# Patient Record
Sex: Female | Born: 1960
Health system: Southern US, Community
[De-identification: ages and names within clinical notes are randomized; demographics above are authoritative.]

## PROBLEM LIST (undated history)

## (undated) DIAGNOSIS — K2270 Barrett's esophagus without dysplasia: Secondary | ICD-10-CM

## (undated) DIAGNOSIS — K219 Gastro-esophageal reflux disease without esophagitis: Secondary | ICD-10-CM

## (undated) DIAGNOSIS — T7840XA Allergy, unspecified, initial encounter: Secondary | ICD-10-CM

## (undated) DIAGNOSIS — IMO0001 Reserved for inherently not codable concepts without codable children: Secondary | ICD-10-CM

## (undated) DIAGNOSIS — G4730 Sleep apnea, unspecified: Secondary | ICD-10-CM

## (undated) DIAGNOSIS — M7120 Synovial cyst of popliteal space [Baker], unspecified knee: Secondary | ICD-10-CM

## (undated) DIAGNOSIS — E785 Hyperlipidemia, unspecified: Secondary | ICD-10-CM

## (undated) HISTORY — PX: TUBAL LIGATION: SHX77

## (undated) HISTORY — PX: ESOPHAGOGASTRODUODENOSCOPY: SHX1529

## (undated) HISTORY — DX: Reserved for inherently not codable concepts without codable children: IMO0001

## (undated) HISTORY — DX: Barrett's esophagus without dysplasia: K22.70

## (undated) HISTORY — DX: Sleep apnea, unspecified: G47.30

## (undated) HISTORY — PX: COLONOSCOPY: SHX5424

## (undated) HISTORY — DX: Synovial cyst of popliteal space (Baker), unspecified knee: M71.20

## (undated) HISTORY — DX: Hyperlipidemia, unspecified: E78.5

## (undated) HISTORY — DX: Gastro-esophageal reflux disease without esophagitis: K21.9

## (undated) HISTORY — PX: NASAL SINUS SURGERY: SHX719

## (undated) HISTORY — DX: Allergy, unspecified, initial encounter: T78.40XA

---

## 1998-11-28 ENCOUNTER — Encounter: Admission: RE | Admit: 1998-11-28 | Discharge: 1999-02-26 | Payer: Self-pay | Admitting: Orthopedic Surgery

## 2001-05-14 ENCOUNTER — Other Ambulatory Visit: Admission: RE | Admit: 2001-05-14 | Discharge: 2001-05-14 | Payer: Self-pay | Admitting: Obstetrics & Gynecology

## 2002-05-31 ENCOUNTER — Other Ambulatory Visit: Admission: RE | Admit: 2002-05-31 | Discharge: 2002-05-31 | Payer: Self-pay | Admitting: Obstetrics & Gynecology

## 2003-08-01 ENCOUNTER — Other Ambulatory Visit: Admission: RE | Admit: 2003-08-01 | Discharge: 2003-08-01 | Payer: Self-pay | Admitting: Obstetrics & Gynecology

## 2003-08-12 ENCOUNTER — Ambulatory Visit (HOSPITAL_COMMUNITY): Admission: RE | Admit: 2003-08-12 | Discharge: 2003-08-12 | Payer: Self-pay | Admitting: Obstetrics & Gynecology

## 2005-01-01 ENCOUNTER — Other Ambulatory Visit: Admission: RE | Admit: 2005-01-01 | Discharge: 2005-01-01 | Payer: Self-pay | Admitting: Obstetrics & Gynecology

## 2005-07-31 ENCOUNTER — Other Ambulatory Visit: Admission: RE | Admit: 2005-07-31 | Discharge: 2005-07-31 | Payer: Self-pay | Admitting: Obstetrics & Gynecology

## 2006-01-22 ENCOUNTER — Other Ambulatory Visit: Admission: RE | Admit: 2006-01-22 | Discharge: 2006-01-22 | Payer: Self-pay | Admitting: Obstetrics & Gynecology

## 2007-10-22 HISTORY — PX: CHOLECYSTECTOMY: SHX55

## 2010-04-09 ENCOUNTER — Encounter: Admission: RE | Admit: 2010-04-09 | Discharge: 2010-04-09 | Payer: Self-pay | Admitting: Family Medicine

## 2011-02-19 ENCOUNTER — Ambulatory Visit (INDEPENDENT_AMBULATORY_CARE_PROVIDER_SITE_OTHER): Payer: Self-pay | Admitting: Internal Medicine

## 2011-03-08 NOTE — Op Note (Signed)
   NAME:  Rachel Larson, Rachel Larson                         ACCOUNT NO.:  [id number]   MEDICAL RECORD NO.:  [id number]                   PATIENT TYPE:  AMB   LOCATION:  SDC                                  FACILITY:  [facility]   PHYSICIAN:  Freddy Finner, M.D.                DATE OF BIRTH:  19-Oct-1961   DATE OF PROCEDURE:  08/12/2003  DATE OF DISCHARGE:                                 OPERATIVE REPORT   PREOPERATIVE DIAGNOSIS:  Multiparity, requests voluntary sterilization.   POSTOPERATIVE DIAGNOSIS:  Multiparity, requests voluntary sterilization plus  symmetrical enlargement of the uterus and minimal evidence of pelvic  endometriosis.   PROCEDURE:  Laparoscopy, bipolar fulguration and sharp division of the  isthmic portion of each fallopian tube. Fulguration of endometriotic implant  in cul-de-sac.   ANESTHESIA:  General.   ESTIMATED BLOOD LOSS:  Less than 5 mL.   COMPLICATIONS:  None.   DESCRIPTION OF PROCEDURE:  The patient was admitted on the morning of  surgery and brought to the operating room and placed under adequate general  anesthesia and placed in the dorsal lithotomy position using the [facility]  stirrup system. Betadine preparation of the abdomen, perineum and vagina was  carried out in the usual fashion. The bladder was evacuated using sterile  technique  using the [facility] catheter. The [place] tenaculum was attached to  the cervix without difficulty. Sterile drapes were applied.   A spinal infraumbilical skin incision was made and a 12-mm disposable non  bladed trocar was introduced. Direct inspection with the laparoscope  revealed  adequate placement with no evidence of injury on entry.  Pneumoperitoneum was allowed to accumulate with CO2 gas.   Systematic examination  of the pelvic and abdominal  contents was carried  out. No abnormal findings were noted except those noted in the postoperative  diagnosis. The isthmic portion of each fallopian tube was then fulgurated  with the  Kleppinger forceps which are bipolar forceps. The fulgurated  segment was divided. Repeat fulguration was done on the left for complete  hemostasis.   The procedure was then terminated. All instruments were removed. Local  anesthesia was placed using 0.5% plain Marcaine at the umbilicus. The  incision was closed with interrupted subcuticular sutures of 3-0 Dexon.   The patient was then awakened and taken to the recovery room in good  condition. She will be discharged with routine post surgical instructions.  She has Vicodin  as needed for postoperative pain.                                               Freddy Finner, M.D.   WRN/MEDQ  D:  08/12/2003  T:  08/12/2003  Job:  [id number]

## 2011-04-09 ENCOUNTER — Ambulatory Visit (INDEPENDENT_AMBULATORY_CARE_PROVIDER_SITE_OTHER): Payer: Self-pay | Admitting: Internal Medicine

## 2011-05-29 ENCOUNTER — Encounter: Payer: Self-pay | Admitting: Cardiology

## 2011-06-26 ENCOUNTER — Ambulatory Visit (INDEPENDENT_AMBULATORY_CARE_PROVIDER_SITE_OTHER): Payer: BC Managed Care – PPO | Admitting: Cardiology

## 2011-06-26 ENCOUNTER — Encounter: Payer: Self-pay | Admitting: Cardiology

## 2011-06-26 VITALS — BP 122/82 | HR 108 | Resp 16 | Ht 60.0 in | Wt 173.0 lb

## 2011-06-26 DIAGNOSIS — E663 Overweight: Secondary | ICD-10-CM | POA: Insufficient documentation

## 2011-06-26 DIAGNOSIS — R9431 Abnormal electrocardiogram [ECG] [EKG]: Secondary | ICD-10-CM

## 2011-06-26 DIAGNOSIS — R079 Chest pain, unspecified: Secondary | ICD-10-CM | POA: Insufficient documentation

## 2011-06-26 NOTE — Progress Notes (Signed)
HPI The patient presents for evaluation of chest discomfort and a slightly abnormal EKG. She has no prior cardiac history the treadmill test some years ago. She reports 6 months worth of jaw and chest discomfort. These do not occur together. She describes a stiffness from her left jaw down to her neck and top of her chest. This happened sporadically. She'll notice some chest discomfort with emotional stress. She doesn't notice this with physical activity. She does not describe associated nausea vomiting or diaphoresis. She does have shortness of breath walking 10 miles to her mailbox but this has been chronic. She is not describing PND or orthopnea. She does have a rapid resting heart rate but no palpitations, presyncope or syncope.  Allergies  Allergen Reactions  . Sulfonamide Derivatives     Current Outpatient Prescriptions  Medication Sig Dispense Refill  . cetirizine (ZYRTEC) 10 MG tablet Take 10 mg by mouth as needed.        . Omega-3 Fatty Acids (FISH OIL PO) Take by mouth daily.        Marland Kitchen omeprazole (PRILOSEC) 20 MG capsule Take 20 mg by mouth daily.          Past Medical History  Diagnosis Date  . Baker cyst     rt knee  . Chest pain   . Reflux     Past Surgical History  Procedure Date  . Cholecystectomy   . Nasal sinus surgery     Family History  Problem Relation Age of Onset  . Diabetes Mother   . Hypertension Brother     History   Social History  . Marital Status: Legally Separated    Spouse Name: N/A    Number of Children: 2  . Years of Education: N/A   Occupational History  . Owns a store    Social History Main Topics  . Smoking status: Former Smoker -- 1.0 packs/day for 18 years    Types: Cigarettes    Quit date: 07/09/1998  . Smokeless tobacco: Not on file  . Alcohol Use: Not on file  . Drug Use: Not on file  . Sexually Active: Not on file   Other Topics Concern  . Not on file   Social History Narrative  . No narrative on file    ROS: Past  positive for reflux, wheezing. Otherwise as stated in the history of present illness negative for all other systems.  PHYSICAL EXAM BP 122/82  Pulse 108  Resp 16  Ht 5' (1.524 m)  Wt 173 lb (78.472 kg)  BMI 33.79 kg/m2 GENERAL:  Well appearing HEENT:  Pupils equal round and reactive, fundi not visualized, oral mucosa unremarkable NECK:  No jugular venous distention, waveform within normal limits, carotid upstroke brisk and symmetric, no bruits, no thyromegaly LYMPHATICS:  No cervical, inguinal adenopathy LUNGS:  Clear to auscultation bilaterally BACK:  No CVA tenderness CHEST:  Unremarkable HEART:  PMI not displaced or sustained,S1 and S2 within normal limits, no S3, no S4, no clicks, no rubs, no murmurs ABD:  Flat, positive bowel sounds normal in frequency in pitch, no bruits, no rebound, no guarding, no midline pulsatile mass, no hepatomegaly, no splenomegaly EXT:  2 plus pulses throughout, no edema, no cyanosis no clubbing SKIN:  No rashes no nodules NEURO:  Cranial nerves II through XII grossly intact, motor grossly intact throughout PSYCH:  Cognitively intact, oriented to person place and time  EKG:  Normal sinus rhythm, rate 70, axis within normal limits, intervals within normal limits,  poor anterior R-wave progression, nonspecific inferior and anterior T-wave inversions 05/29/11  ASSESSMENT AND PLAN

## 2011-06-26 NOTE — Assessment & Plan Note (Signed)
Her chest pain is atypical and EKG nonspecific.  However, it is reasonable to screen her with a stress test.  I will bring the patient back for a POET (Plain Old Exercise Test). This will allow me to screen for obstructive coronary disease, risk stratify and very importantly provide a prescription for exercise.

## 2011-06-26 NOTE — Assessment & Plan Note (Signed)
I will discuss this with her more at the time of the POET.  I will give her specifics on diet and exercise.

## 2011-06-26 NOTE — Patient Instructions (Addendum)
Your physician has requested that you have an exercise tolerance test. For further information please visit [email or web address]. Please also follow instruction sheet, as given.  The current medical regimen is effective;  continue present plan and medications.  

## 2011-07-17 ENCOUNTER — Ambulatory Visit (INDEPENDENT_AMBULATORY_CARE_PROVIDER_SITE_OTHER): Payer: BC Managed Care – PPO | Admitting: Cardiology

## 2011-07-17 DIAGNOSIS — R0602 Shortness of breath: Secondary | ICD-10-CM

## 2011-07-17 NOTE — Progress Notes (Signed)
Exercise Treadmill Test  Pre-Exercise Testing Evaluation Rhythm: normal sinus  Rate: 76   PR:  .17 QRS:  .08  QT:  .36 QTc: .41     Test  Exercise Tolerance Test Ordering MD: Angelina Sheriff, MD  Interpreting MD:  Angelina Sheriff, MD  Unique Test No: 1  Treadmill:  1  Indication for ETT: Abnormal EKG  Contraindication to ETT: No   Stress Modality: exercise - treadmill  Cardiac Imaging Performed: non   Protocol: standard Bruce - maximal  Max BP:  149/74  Max MPHR (bpm):  172 85% MPR (bpm):  146  MPHR obtained (bpm):  166 % MPHR obtained:  94  Reached 85% MPHR (min:sec):  4:46 Total Exercise Time (min-sec):  9:00  Workload in METS:  10.1 Borg Scale: 15  Reason ETT Terminated:  desired heart rate attained    ST Segment Analysis At Rest: normal ST segments - no evidence of significant ST depression With Exercise: no evidence of significant ST depression  Other Information Arrhythmia:  No Angina during ETT:  absent (0) Quality of ETT:  diagnostic  ETT Interpretation:  normal - no evidence of ischemia by ST analysis  Comments: The patient had an excellent exercise tolerance.  There was no chest pain.  There was an appropriate level of dyspnea.  There were no arrhythmias, a normal heart rate response and normal BP response.  There were no ischemic ST T wave changes and a normal heart rate recovery.  Recommendations: Negative adequate ETT.  No further testing is indicated.  Based on the above I gave the patient a prescription for exercise.

## 2012-05-04 ENCOUNTER — Other Ambulatory Visit: Payer: Self-pay | Admitting: Family Medicine

## 2012-05-04 DIAGNOSIS — R109 Unspecified abdominal pain: Secondary | ICD-10-CM

## 2012-05-06 ENCOUNTER — Other Ambulatory Visit: Payer: BC Managed Care – PPO

## 2012-05-07 ENCOUNTER — Ambulatory Visit
Admission: RE | Admit: 2012-05-07 | Discharge: 2012-05-07 | Disposition: A | Discharge status: Home / Self Care | Payer: BC Managed Care – PPO | Source: Ambulatory Visit | Attending: Family Medicine | Admitting: Family Medicine

## 2012-05-07 DIAGNOSIS — R109 Unspecified abdominal pain: Secondary | ICD-10-CM

## 2013-06-30 ENCOUNTER — Ambulatory Visit: Payer: Self-pay | Admitting: Family Medicine

## 2013-07-22 ENCOUNTER — Ambulatory Visit (INDEPENDENT_AMBULATORY_CARE_PROVIDER_SITE_OTHER): Payer: BC Managed Care – PPO | Admitting: Family Medicine

## 2013-07-22 ENCOUNTER — Encounter: Payer: Self-pay | Admitting: Family Medicine

## 2013-07-22 VITALS — BP 133/87 | HR 78 | Temp 99.1°F | Ht 60.0 in | Wt 172.0 lb

## 2013-07-22 DIAGNOSIS — J309 Allergic rhinitis, unspecified: Secondary | ICD-10-CM | POA: Insufficient documentation

## 2013-07-22 DIAGNOSIS — K219 Gastro-esophageal reflux disease without esophagitis: Secondary | ICD-10-CM

## 2013-07-22 DIAGNOSIS — E785 Hyperlipidemia, unspecified: Secondary | ICD-10-CM

## 2013-07-22 DIAGNOSIS — Z0000 Encounter for general adult medical examination without abnormal findings: Secondary | ICD-10-CM

## 2013-07-22 DIAGNOSIS — Z23 Encounter for immunization: Secondary | ICD-10-CM

## 2013-07-22 LAB — POCT CBC
Granulocyte percent: 53.1 %G (ref 37–80)
HCT, POC: 38.1 % (ref 37.7–47.9)
Hemoglobin: 13.3 g/dL (ref 12.2–16.2)
MCHC: 34.9 g/dL (ref 31.8–35.4)
MCV: 91 fL (ref 80–97)
Platelet Count, POC: 215 10*3/uL (ref 142–424)
RDW, POC: 13.3 %

## 2013-07-22 NOTE — Progress Notes (Signed)
Subjective:    Patient ID: Rachel Larson, female    DOB: 01-03-1961, 52 y.o.   MRN: [id number]  HPI PATIENT HERE TODAY FOR ANNUAL WELLNESS EXAM assistance. He has concerns regarding an area that swells and her right axilla. Patient is having a lot of stress in her life at the present time and a lot of this is financial in nature. She is going back to school and trying to become a Agricultural engineer or CMA.     Patient Active Problem List   Diagnosis Date Noted  . Chest pain 06/26/2011  . Overweight 06/26/2011   Outpatient Encounter Prescriptions as of 07/22/2013  Medication Sig Dispense Refill  . cetirizine (ZYRTEC) 10 MG tablet Take 10 mg by mouth as needed.        Marland Kitchen omeprazole (PRILOSEC) 20 MG capsule Take 20 mg by mouth daily.        . [DISCONTINUED] Omega-3 Fatty Acids (FISH OIL PO) Take by mouth daily.         No facility-administered encounter medications on file as of 07/22/2013.    Review of Systems  Constitutional: Negative.   HENT: Negative.   Eyes: Negative.   Respiratory: Negative.   Cardiovascular: Negative.   Gastrointestinal: Negative.   Endocrine: Negative.   Genitourinary: Negative.   Musculoskeletal: Negative.   Skin: Negative.        AREA UNDER RIGHT AXILLA SWELLS OFF/ON  Allergic/Immunologic: Negative.   Neurological: Negative.   Hematological: Negative.   Psychiatric/Behavioral: Negative.        Objective:   Physical Exam  Nursing note and vitals reviewed. Constitutional: She is oriented to person, place, and time. She appears well-developed and well-nourished. No distress.  HENT:  Head: Normocephalic and atraumatic.  Right Ear: External ear normal.  Left Ear: External ear normal.  Nose: Nose normal.  Mouth/Throat: Oropharynx is clear and moist. No oropharyngeal exudate.  Eyes: Conjunctivae and EOM are normal. Pupils are equal, round, and reactive to light. Right eye exhibits no discharge. Left eye exhibits no discharge. No scleral icterus.   Neck: Normal range of motion. Neck supple. No thyromegaly present.  Cardiovascular: Normal rate, regular rhythm, normal heart sounds and intact distal pulses.  Exam reveals no gallop and no friction rub.   No murmur heard. At 72 per min  Pulmonary/Chest: Effort normal and breath sounds normal. No respiratory distress. She has no wheezes. She has no rales.  Abdominal: Soft. Bowel sounds are normal. She exhibits no mass. There is no tenderness. There is no rebound and no guarding.  Musculoskeletal: Normal range of motion. She exhibits no edema and no tenderness.  Lymphadenopathy:    She has no cervical adenopathy.  Neurological: She is alert and oriented to person, place, and time. She has normal reflexes.  Skin: Skin is warm and dry.  Psychiatric: She has a normal mood and affect. Her behavior is normal. Judgment and thought content normal.   If the area in the right axilla continues to flare and become enlarged please return to clinic when that occur, on physical exam today there was no sign of any swelling or adenopathy in the right axilla  BP 133/87  Pulse 78  Temp(Src) 99.1 F (37.3 C) (Oral)  Ht 5' (1.524 m)  Wt 172 lb (78.019 kg)  BMI 33.59 kg/m2  EKG:-Unchanged from previous EKG, poor R-wave progression no acute ST segment elevated       Assessment & Plan:   1. Annual physical exam   2. GERD (  gastroesophageal reflux disease)   3. Hyperlipidemia   4. Allergic rhinitis    Orders Placed This Encounter  Procedures  . BMP8+EGFR  . Hepatic function panel  . NMR, lipoprofile  . Thyroid Panel With TSH  . Vit D  25 hydroxy (rtn osteoporosis monitoring)  . Varicella zoster antibody, IgG  . Measles/Mumps/Rubella Immunity  . POCT CBC    Patient Instructions  Continue current medications. Continue good aggressive therapeutic lifestyle changes.  Fall precautions discussed with patient. Follow up as planned and earlier as needed. Check with insurance as far as getting a  Zostavax or a Prevnar shot We will arrange for a colonoscopy with Dr.Dora Juanda Chance Monitor blood pressure if possible at home and watch sodium  intake Return to clinic and let us recheck the right axilla if you continue to have problems with the swelling that occurs off and on   Nyra Capes MD

## 2013-07-22 NOTE — Patient Instructions (Addendum)
Continue current medications. Continue good aggressive therapeutic lifestyle changes.  Fall precautions discussed with patient. Follow up as planned and earlier as needed. Check with insurance as far as getting a Zostavax or a Prevnar shot We will arrange for a colonoscopy with Dr.Dora Juanda Chance Monitor blood pressure if possible at home and watch sodium  intake Return to clinic and let us recheck the right axilla if you continue to have problems with the swelling that occurs off and on

## 2013-07-24 LAB — NMR, LIPOPROFILE
Cholesterol: 214 mg/dL — ABNORMAL HIGH (ref <200)
HDL Cholesterol by NMR: 52 mg/dL (ref >=40)
LDL Particle Number: 1808 nmol/L — ABNORMAL HIGH (ref <1000)
Triglycerides by NMR: 167 mg/dL — ABNORMAL HIGH (ref <150)

## 2013-07-24 LAB — BMP8+EGFR
BUN/Creatinine Ratio: 15 (ref 9–23)
Calcium: 9.5 mg/dL (ref 8.7–10.2)
GFR calc Af Amer: 113 mL/min/{1.73_m2} (ref >59)
GFR calc non Af Amer: 98 mL/min/{1.73_m2} (ref >59)
Sodium: 140 mmol/L (ref 134–144)

## 2013-07-24 LAB — VARICELLA ZOSTER ANTIBODY, IGG: Varicella zoster IgG: 1222 index (ref >165)

## 2013-07-24 LAB — THYROID PANEL WITH TSH
Free Thyroxine Index: 2.1 (ref 1.2–4.9)
TSH: 1.52 u[IU]/mL (ref 0.450–4.500)

## 2013-07-24 LAB — HEPATIC FUNCTION PANEL
AST: 16 IU/L (ref 0–40)
Alkaline Phosphatase: 83 IU/L (ref 39–117)
Total Bilirubin: 0.4 mg/dL (ref 0.0–1.2)
Total Protein: 7.1 g/dL (ref 6.0–8.5)

## 2013-07-24 LAB — VITAMIN D 25 HYDROXY (VIT D DEFICIENCY, FRACTURES): Vit D, 25-Hydroxy: 31.9 ng/mL (ref 30.0–100.0)

## 2013-07-24 LAB — MEASLES/MUMPS/RUBELLA IMMUNITY
MUMPS ABS, IGG: 58.9 AU/mL (ref >10.9)
Rubella Antibodies, IGG: 2.58 index (ref >0.99)

## 2013-07-26 ENCOUNTER — Telehealth: Payer: Self-pay | Admitting: Family Medicine

## 2013-07-29 NOTE — Telephone Encounter (Signed)
PT NOTIFED ON 07/27/13 ABOUT LABS

## 2013-08-13 ENCOUNTER — Encounter: Payer: Self-pay | Admitting: Internal Medicine

## 2013-09-14 ENCOUNTER — Telehealth: Payer: Self-pay | Admitting: Family Medicine

## 2013-09-15 NOTE — Telephone Encounter (Signed)
This is okay to call in a Z-Pak

## 2013-09-15 NOTE — Telephone Encounter (Signed)
Called in z pack

## 2013-09-17 ENCOUNTER — Telehealth: Payer: Self-pay | Admitting: Family Medicine

## 2013-09-17 NOTE — Telephone Encounter (Signed)
Left detailed message that rx was called into pharmacy.

## 2013-09-27 ENCOUNTER — Encounter: Payer: Self-pay | Admitting: Internal Medicine

## 2013-09-27 ENCOUNTER — Ambulatory Visit (AMBULATORY_SURGERY_CENTER): Payer: Self-pay

## 2013-09-27 VITALS — Ht 60.0 in | Wt 176.0 lb

## 2013-09-27 DIAGNOSIS — Z1211 Encounter for screening for malignant neoplasm of colon: Secondary | ICD-10-CM

## 2013-09-27 MED ORDER — MOVIPREP 100 G PO SOLR: 1.0000 | Freq: Once | ORAL | Status: DC

## 2013-10-11 ENCOUNTER — Ambulatory Visit (AMBULATORY_SURGERY_CENTER): Payer: BC Managed Care – PPO | Admitting: Internal Medicine

## 2013-10-11 ENCOUNTER — Encounter: Payer: Self-pay | Admitting: Internal Medicine

## 2013-10-11 VITALS — BP 131/76 | HR 80 | Temp 96.7°F | Resp 25 | Ht 61.0 in | Wt 176.0 lb

## 2013-10-11 DIAGNOSIS — Z1211 Encounter for screening for malignant neoplasm of colon: Secondary | ICD-10-CM

## 2013-10-11 MED ORDER — SODIUM CHLORIDE 0.9 % IV SOLN: 500.0000 mL | INTRAVENOUS | Status: DC

## 2013-10-11 NOTE — Op Note (Signed)
[facility] [address] [place], [address]   COLONOSCOPY PROCEDURE REPORT  PATIENT: Rachel, Larson  MR#: [id number] BIRTHDATE: 09-Mar-1961 , 51  yrs. old GENDER: Female ENDOSCOPIST: Hart Carwin, MD REFERRED XB:JYNWGN Christell Constant, M.D. PROCEDURE DATE:  10/11/2013 PROCEDURE:   Colonoscopy, screening First Screening Colonoscopy - Avg.  risk and is 50 yrs.  old or older Yes.  Prior Negative Screening - Now for repeat screening. N/A  History of Adenoma - Now for follow-up colonoscopy & has been > or = to 3 yrs.  N/A  Polyps Removed Today? No.  Recommend repeat exam, <10 yrs? No. ASA CLASS:   Class I INDICATIONS:Average risk patient for colon cancer. MEDICATIONS: MAC sedation, administered by CRNA and Propofol (Diprivan) 230 mg IV  DESCRIPTION OF PROCEDURE:   After the risks benefits and alternatives of the procedure were thoroughly explained, informed consent was obtained.  A digital rectal exam revealed no abnormalities of the rectum.   The LB [id number]  endoscope was introduced through the anus and advanced to the cecum, which was identified by both the appendix and ileocecal valve. No adverse events experienced.   The quality of the prep was good, using MoviPrep  The instrument was then slowly withdrawn as the colon was fully examined.      COLON FINDINGS: A normal appearing cecum, ileocecal valve, and appendiceal orifice were identified.  The ascending, hepatic flexure, transverse, splenic flexure, descending, sigmoid colon and rectum appeared unremarkable.  No polyps or cancers were seen. Retroflexed views revealed no abnormalities. The time to cecum=6 minutes 40 seconds.  Withdrawal time=6 minutes 01 seconds.  The scope was withdrawn and the procedure completed. COMPLICATIONS: There were no complications.  ENDOSCOPIC IMPRESSION: Normal colon  RECOMMENDATIONS: high fiber diet Recall colonoscopy in 10 years   eSigned:  Hart Carwin, MD  10/11/2013 2:38 PM   cc:   PATIENT NAME:  Rachel, Larson MR#: [id number]

## 2013-10-11 NOTE — Progress Notes (Signed)
Procedure ends, to recovery, report given and VSS. 

## 2013-10-11 NOTE — Patient Instructions (Signed)
YOU HAD AN ENDOSCOPIC PROCEDURE TODAY AT THE [facility]: Refer to the procedure report that was given to you for any specific questions about what was found during the examination.  If the procedure report does not answer your questions, please call your gastroenterologist to clarify.  If you requested that your care partner not be given the details of your procedure findings, then the procedure report has been included in a sealed envelope for you to review at your convenience later.  YOU SHOULD EXPECT: Some feelings of bloating in the abdomen. Passage of more gas than usual.  Walking can help get rid of the air that was put into your GI tract during the procedure and reduce the bloating. If you had a lower endoscopy (such as a colonoscopy or flexible sigmoidoscopy) you may notice spotting of blood in your stool or on the toilet paper. If you underwent a bowel prep for your procedure, then you may not have a normal bowel movement for a few days.  DIET: Your first meal following the procedure should be a light meal and then it is ok to progress to your normal diet.  A half-sandwich or bowl of soup is an example of a good first meal.  Heavy or fried foods are harder to digest and may make you feel nauseous or bloated.  Likewise meals heavy in dairy and vegetables can cause extra gas to form and this can also increase the bloating.  Drink plenty of fluids but you should avoid alcoholic beverages for 24 hours.  ACTIVITY: Your care partner should take you home directly after the procedure.  You should plan to take it easy, moving slowly for the rest of the day.  You can resume normal activity the day after the procedure however you should NOT DRIVE or use heavy machinery for 24 hours (because of the sedation medicines used during the test).    SYMPTOMS TO REPORT IMMEDIATELY: A gastroenterologist can be reached at any hour.  During normal business hours, 8:30 AM to 5:00 PM Monday through Friday,  call [phone].  After hours and on weekends, please call the GI answering service at [phone] who will take a message and have the physician on call contact you.   Following lower endoscopy (colonoscopy or flexible sigmoidoscopy):  Excessive amounts of blood in the stool  Significant tenderness or worsening of abdominal pains  Swelling of the abdomen that is new, acute  Fever of 100F or higher   FOLLOW UP: If any biopsies were taken you will be contacted by phone or by letter within the next 1-3 weeks.  Call your gastroenterologist if you have not heard about the biopsies in 3 weeks.  Our staff will call the home number listed on your records the next business day following your procedure to check on you and address any questions or concerns that you may have at that time regarding the information given to you following your procedure. This is a courtesy call and so if there is no answer at the home number and we have not heard from you through the emergency physician on call, we will assume that you have returned to your regular daily activities without incident.  SIGNATURES/CONFIDENTIALITY: You and/or your care partner have signed paperwork which will be entered into your electronic medical record.  These signatures attest to the fact that that the information above on your After Visit Summary has been reviewed and is understood.  Full responsibility of the confidentiality of   this discharge information lies with you and/or your care-partner.    A handout was given to your care partner on a high fiber diet with liberal fluid intake. You may resume your current medications today. Please call if any questions or concerns.

## 2013-10-11 NOTE — Progress Notes (Signed)
No complaints noted in the recovery room. Maw   

## 2013-10-12 ENCOUNTER — Telehealth: Payer: Self-pay | Admitting: *Deleted

## 2013-10-12 NOTE — Telephone Encounter (Signed)
  Follow up Call-  Call back number 10/11/2013  Post procedure Call Back phone  # [phone]  Permission to leave phone message Yes     Patient questions:  Do you have a fever, pain , or abdominal swelling? no Pain Score  0 *  Have you tolerated food without any problems? yes  Have you been able to return to your normal activities? yes  Do you have any questions about your discharge instructions: Diet   no Medications  no Follow up visit  no  Do you have questions or concerns about your Care? no  Actions: * If pain score is 4 or above: No action needed, pain <4.

## 2013-10-13 ENCOUNTER — Encounter: Payer: Self-pay | Admitting: General Practice

## 2013-10-13 ENCOUNTER — Ambulatory Visit (INDEPENDENT_AMBULATORY_CARE_PROVIDER_SITE_OTHER): Payer: BC Managed Care – PPO | Admitting: General Practice

## 2013-10-13 VITALS — BP 148/80 | HR 85 | Temp 97.1°F | Ht 61.0 in | Wt 176.0 lb

## 2013-10-13 DIAGNOSIS — J329 Chronic sinusitis, unspecified: Secondary | ICD-10-CM

## 2013-10-13 MED ORDER — METHYLPREDNISOLONE ACETATE 80 MG/ML IJ SUSP
80.0000 mg | Freq: Once | INTRAMUSCULAR | Status: AC
  Administered 2013-10-13: 80 mg via INTRAMUSCULAR

## 2013-10-13 MED ORDER — PREDNISONE (PAK) 10 MG PO TABS: ORAL_TABLET | ORAL | Status: DC

## 2013-10-13 MED ORDER — AMOXICILLIN-POT CLAVULANATE 875-125 MG PO TABS: 1.0000 | ORAL_TABLET | Freq: Two times a day (BID) | ORAL | Status: DC

## 2013-10-13 NOTE — Patient Instructions (Signed)

## 2013-10-13 NOTE — Progress Notes (Signed)
   Subjective:    Patient ID: Rachel Larson, female    DOB: [birth year], 52 y.o.   MRN: [id number]  Sinusitis This is a new problem. The current episode started in the past 7 days. The problem has been gradually worsening since onset. There has been no fever. Associated symptoms include congestion, coughing and sinus pressure. Pertinent negatives include no hoarse voice, shortness of breath or sore throat. Past treatments include acetaminophen and oral decongestants. The treatment provided mild relief.      Review of Systems  HENT: Positive for congestion and sinus pressure. Negative for hoarse voice and sore throat.   Respiratory: Positive for cough. Negative for chest tightness and shortness of breath.   Cardiovascular: Negative for chest pain and palpitations.  Neurological: Negative for dizziness and weakness.       Objective:   Physical Exam  Constitutional: She appears well-developed and well-nourished.  HENT:  Head: Normocephalic and atraumatic.  Nose: Right sinus exhibits maxillary sinus tenderness and frontal sinus tenderness. Left sinus exhibits maxillary sinus tenderness and frontal sinus tenderness.  Mouth/Throat: Posterior oropharyngeal erythema present.  Neck: Normal range of motion. Neck supple. No thyromegaly present.  Cardiovascular: Normal rate, regular rhythm and normal heart sounds.   No murmur heard. Pulmonary/Chest: Effort normal and breath sounds normal. No respiratory distress. She exhibits no tenderness.  Lymphadenopathy:    She has no cervical adenopathy.  Neurological: She is alert.  Skin: Skin is warm and dry. No rash noted.  Psychiatric: She has a normal mood and affect.          Assessment & Plan:  1. Sinusitis - methylPREDNISolone acetate (DEPO-MEDROL) injection 80 mg; Inject 1 mL (80 mg total) into the muscle once. - predniSONE (STERAPRED UNI-PAK) 10 MG tablet; Start on 10/14/13  Dispense: 21 tablet; Refill: 0 - amoxicillin-clavulanate  (AUGMENTIN) 875-125 MG per tablet; Take 1 tablet by mouth 2 (two) times daily.  Dispense: 20 tablet; Refill: 0 -adequate fluids -RTO if symptoms worsen or unresolved Patient verbalized understanding Coralie Keens, FNP-C

## 2013-11-01 ENCOUNTER — Encounter: Payer: Self-pay | Admitting: Cardiology

## 2014-01-31 ENCOUNTER — Encounter: Payer: Self-pay | Admitting: Family Medicine

## 2014-01-31 ENCOUNTER — Ambulatory Visit (INDEPENDENT_AMBULATORY_CARE_PROVIDER_SITE_OTHER): Payer: BC Managed Care – PPO | Admitting: Family Medicine

## 2014-01-31 VITALS — BP 138/83 | HR 80 | Temp 97.7°F | Ht 61.0 in | Wt 177.0 lb

## 2014-01-31 DIAGNOSIS — Z13820 Encounter for screening for osteoporosis: Secondary | ICD-10-CM

## 2014-01-31 DIAGNOSIS — K219 Gastro-esophageal reflux disease without esophagitis: Secondary | ICD-10-CM

## 2014-01-31 DIAGNOSIS — Z139 Encounter for screening, unspecified: Secondary | ICD-10-CM

## 2014-01-31 DIAGNOSIS — E785 Hyperlipidemia, unspecified: Secondary | ICD-10-CM

## 2014-01-31 DIAGNOSIS — E559 Vitamin D deficiency, unspecified: Secondary | ICD-10-CM

## 2014-01-31 DIAGNOSIS — Z111 Encounter for screening for respiratory tuberculosis: Secondary | ICD-10-CM

## 2014-01-31 LAB — POCT CBC
GRANULOCYTE PERCENT: 64 % (ref 37–80)
HEMATOCRIT: 38.8 % (ref 37.7–47.9)
HEMOGLOBIN: 12.3 g/dL (ref 12.2–16.2)
LYMPH, POC: 2 (ref 0.6–3.4)
MCH: 29.8 pg (ref 27–31.2)
MCHC: 31.6 g/dL — AB (ref 31.8–35.4)
MCV: 94.2 fL (ref 80–97)
MPV: 8.4 fL (ref 0–99.8)
PLATELET COUNT, POC: 199 10*3/uL (ref 142–424)
POC Granulocyte: 4 (ref 2–6.9)
POC LYMPH PERCENT: 32.2 %L (ref 10–50)
RBC: 4.1 M/uL (ref 4.04–5.48)
RDW, POC: 12.5 %
WBC: 6.3 10*3/uL (ref 4.6–10.2)

## 2014-01-31 NOTE — Progress Notes (Signed)
Subjective:    Patient ID: Rachel Larson, female    DOB: [birth year], 53 y.o.   MRN: [id number]  HPI Pt here for follow up and management of chronic medical problems. This patient has a history of allergic rhinitis and GERD. On health maintenance issues she is due to get a DEXA scan, return and FOBT, and get her lab work. She will check with her insurance regarding the Prevnar vaccine. She also had a colonoscopy in December 2014 which was within normal limits and will not need to be repeated for 10 years.       Patient Active Problem List   Diagnosis Date Noted  . Hyperlipidemia 01/31/2014  . Allergic rhinitis 07/22/2013  . GERD (gastroesophageal reflux disease) 07/22/2013  . Chest pain 06/26/2011  . Overweight 06/26/2011   Outpatient Encounter Prescriptions as of 01/31/2014  Medication Sig  . esomeprazole (NEXIUM) 20 MG packet Take 20 mg by mouth daily before breakfast.  . Phenylephrine-Ibuprofen (ADVIL CONGESTION RELIEF PO) Take by mouth.  Marland Kitchen VITAMIN D, CHOLECALCIFEROL, PO Take by mouth. 5000 iu twice weekly  . [DISCONTINUED] cetirizine (ZYRTEC) 10 MG tablet Take 10 mg by mouth as needed.    . [DISCONTINUED] amoxicillin-clavulanate (AUGMENTIN) 875-125 MG per tablet Take 1 tablet by mouth 2 (two) times daily.  . [DISCONTINUED] predniSONE (STERAPRED UNI-PAK) 10 MG tablet Start on 10/14/13    Review of Systems  Constitutional: Negative.   HENT: Negative.   Eyes: Negative.   Respiratory: Negative.   Cardiovascular: Negative.   Gastrointestinal: Negative.   Endocrine: Negative.   Genitourinary: Negative.   Musculoskeletal: Negative.   Skin: Negative.   Allergic/Immunologic: Negative.   Neurological: Negative.   Hematological: Negative.   Psychiatric/Behavioral: Negative.        Objective:   Physical Exam  Nursing note and vitals reviewed. Constitutional: She is oriented to person, place, and time. She appears well-developed and well-nourished. No distress.  Pleasant  and cooperative and indicates her GERD problem is under good control at the present time.  HENT:  Head: Normocephalic and atraumatic.  Right Ear: External ear normal.  Left Ear: External ear normal.  Nose: Nose normal.  Mouth/Throat: Oropharynx is clear and moist. No oropharyngeal exudate.  Eyes: Conjunctivae and EOM are normal. Pupils are equal, round, and reactive to light. Right eye exhibits no discharge. Left eye exhibits no discharge. No scleral icterus.  Neck: Normal range of motion. Neck supple. No thyromegaly present.  No carotid bruits  Cardiovascular: Normal rate, regular rhythm, normal heart sounds and intact distal pulses.  Exam reveals no gallop and no friction rub.   No murmur heard. At 84 per minute  Pulmonary/Chest: Effort normal and breath sounds normal. No respiratory distress. She has no wheezes. She has no rales. She exhibits no tenderness.  Abdominal: Soft. Bowel sounds are normal. She exhibits no mass. There is no tenderness. There is no rebound and no guarding.   obesity  Musculoskeletal: Normal range of motion. She exhibits no edema and no tenderness.  Lymphadenopathy:    She has no cervical adenopathy.  Neurological: She is alert and oriented to person, place, and time. She has normal reflexes. No cranial nerve deficit.  Skin: Skin is warm and dry. No rash noted.  Psychiatric: She has a normal mood and affect. Her behavior is normal. Judgment and thought content normal.   BP 138/83  Pulse 80  Temp(Src) 97.7 F (36.5 C) (Oral)  Ht '5\' 1"'  (1.549 m)  Wt 177 lb (80.287 kg)  BMI 33.46 kg/m2  LMP 01/14/2014        Assessment & Plan:  1. GERD (gastroesophageal reflux disease) - POCT CBC - BMP8+EGFR - Hepatic function panel  2. Hyperlipidemia - POCT CBC - BMP8+EGFR - Hepatic function panel - NMR, lipoprofile  3. Vitamin D deficiency - Vit D  25 hydroxy (rtn osteoporosis monitoring)  4. Screening - TB Skin Test  5. Osteoporosis screening - DG Bone  Density; Future  No orders of the defined types were placed in this encounter.     Patient Instructions  Continue current medications. Continue good therapeutic lifestyle changes which include good diet and exercise. Fall precautions discussed with patient. If an FOBT was given today- please return it to our front desk. If you are over 5 years old - you may need Prevnar 49 or the adult Pneumonia vaccine.  Please check with your insurance regarding the Prevnar vaccine and the shingles back saying Return the FOBT We will call you with the results of the lab work once those results are available We will also schedule you to have a DEXA scan.   Arrie Senate MD

## 2014-01-31 NOTE — Patient Instructions (Addendum)
Continue current medications. Continue good therapeutic lifestyle changes which include good diet and exercise. Fall precautions discussed with patient. If an FOBT was given today- please return it to our front desk. If you are over 52 years old - you may need Prevnar 51 or the adult Pneumonia vaccine.  Please check with your insurance regarding the Prevnar vaccine and the shingles back saying Return the FOBT We will call you with the results of the lab work once those results are available We will also schedule you to have a DEXA scan.

## 2014-02-01 LAB — NMR, LIPOPROFILE
Cholesterol: 199 mg/dL (ref <200)
HDL Cholesterol by NMR: 50 mg/dL (ref >=40)
HDL Particle Number: 37.5 umol/L (ref >=30.5)
LDL Particle Number: 1686 nmol/L — ABNORMAL HIGH (ref <1000)
LDL SIZE: 21 nm (ref >20.5)
LDLC SERPL CALC-MCNC: 118 mg/dL — ABNORMAL HIGH (ref <100)
LP-IR SCORE: 63 — AB (ref <=45)
SMALL LDL PARTICLE NUMBER: 872 nmol/L — AB (ref <=527)
Triglycerides by NMR: 156 mg/dL — ABNORMAL HIGH (ref <150)

## 2014-02-01 LAB — BMP8+EGFR
BUN / CREAT RATIO: 14 (ref 9–23)
BUN: 8 mg/dL (ref 6–24)
CO2: 25 mmol/L (ref 18–29)
CREATININE: 0.59 mg/dL (ref 0.57–1.00)
Calcium: 9.4 mg/dL (ref 8.7–10.2)
Chloride: 102 mmol/L (ref 97–108)
GFR calc non Af Amer: 106 mL/min/{1.73_m2} (ref >59)
GFR, EST AFRICAN AMERICAN: 123 mL/min/{1.73_m2} (ref >59)
Glucose: 90 mg/dL (ref 65–99)
Potassium: 4 mmol/L (ref 3.5–5.2)
SODIUM: 140 mmol/L (ref 134–144)

## 2014-02-01 LAB — HEPATIC FUNCTION PANEL
ALK PHOS: 72 IU/L (ref 39–117)
ALT: 9 IU/L (ref 0–32)
AST: 12 IU/L (ref 0–40)
Albumin: 4.1 g/dL (ref 3.5–5.5)
BILIRUBIN TOTAL: 0.3 mg/dL (ref 0.0–1.2)
Bilirubin, Direct: 0.09 mg/dL (ref 0.00–0.40)
Total Protein: 6.4 g/dL (ref 6.0–8.5)

## 2014-02-01 LAB — VITAMIN D 25 HYDROXY (VIT D DEFICIENCY, FRACTURES): VIT D 25 HYDROXY: 30.9 ng/mL (ref 30.0–100.0)

## 2014-02-02 ENCOUNTER — Encounter: Payer: Self-pay | Admitting: *Deleted

## 2014-02-02 LAB — TB SKIN TEST
Induration: 0 mm
TB Skin Test: NEGATIVE

## 2014-02-03 ENCOUNTER — Other Ambulatory Visit: Payer: BC Managed Care – PPO

## 2014-02-03 DIAGNOSIS — Z1212 Encounter for screening for malignant neoplasm of rectum: Secondary | ICD-10-CM

## 2014-02-03 NOTE — Progress Notes (Signed)
Patient dropped off fobt 

## 2014-02-05 LAB — FECAL OCCULT BLOOD, IMMUNOCHEMICAL: Fecal Occult Bld: NEGATIVE

## 2014-03-30 ENCOUNTER — Ambulatory Visit (INDEPENDENT_AMBULATORY_CARE_PROVIDER_SITE_OTHER): Payer: BC Managed Care – PPO

## 2014-03-30 ENCOUNTER — Ambulatory Visit (INDEPENDENT_AMBULATORY_CARE_PROVIDER_SITE_OTHER): Payer: BC Managed Care – PPO | Admitting: Pharmacist

## 2014-03-30 ENCOUNTER — Encounter: Payer: Self-pay | Admitting: Pharmacist

## 2014-03-30 VITALS — BP 133/80 | HR 82 | Ht 60.0 in | Wt 178.0 lb

## 2014-03-30 DIAGNOSIS — Z13820 Encounter for screening for osteoporosis: Secondary | ICD-10-CM

## 2014-03-30 DIAGNOSIS — M899 Disorder of bone, unspecified: Secondary | ICD-10-CM

## 2014-03-30 DIAGNOSIS — M949 Disorder of cartilage, unspecified: Secondary | ICD-10-CM

## 2014-03-30 DIAGNOSIS — E8881 Metabolic syndrome: Secondary | ICD-10-CM | POA: Insufficient documentation

## 2014-03-30 DIAGNOSIS — E785 Hyperlipidemia, unspecified: Secondary | ICD-10-CM

## 2014-03-30 DIAGNOSIS — M8580 Other specified disorders of bone density and structure, unspecified site: Secondary | ICD-10-CM

## 2014-03-30 LAB — HM DEXA SCAN

## 2014-03-30 MED ORDER — ROSUVASTATIN CALCIUM 10 MG PO TABS: 10.0000 mg | ORAL_TABLET | Freq: Every day | ORAL | Status: DC

## 2014-03-30 NOTE — Patient Instructions (Signed)

## 2014-03-30 NOTE — Progress Notes (Signed)
Patient ID: Rachel Larson, female   DOB: 1960/12/10, 53 y.o.   MRN: [id number]   [facility] Current Height: Height: 5' (152.4 cm)       Current Weight: Weight: 178 lb (80.74 kg)       Ethnicity:Caucasian  BP: BP: 133/80 mmHg     HR:  Pulse Rate: 82      HPI: Does pt already have a diagnosis of:  Osteopenia?  No Osteoporosis?  No  Back Pain?  No       Kyphosis?  No Prior fracture?  Yes - left foot Med(s) for Osteoporosis/Osteopenia:  none Med(s) previously tried for Osteoporosis/Osteopenia:  none                                                             PMH: Age at menopause:  3's Hysterectomy?  No Oophorectomy?  No HRT? No Steroid Use?  No Thyroid med?  No History of cancer?  No History of digestive disorders (ie Crohn's)?  Yes - GERD on PPI Current or previous eating disorders?  No Last Vitamin D Result:  30.9 (01/31/2014) Last GFR Result:  106 (01/31/2014)   FH/SH: Family history of osteoporosis?  No Parent with history of hip fracture?  No Family history of breast cancer?  No Exercise?  No Smoking?  No Alcohol?  No    Calcium Assessment Calcium Intake  # of servings/day  Calcium mg  Milk (8 oz) 1  x  300  = 300mg   Yogurt (4 oz) 0 x  200 = 0  Cheese (1 oz) 1 x  200 = 200mg   Other Calcium sources   250mg   Ca supplement 0 = 0   Estimated calcium intake per day 750mg     DEXA Results Date of Test T-Score for AP Spine L1-L4 T-Score for Total Left Hip T-Score for Total Right Hip  03/30/2014 -0.3 0.2 -0.1       ** T-Score at neck of  Left Hip = -1.2 today          FRAX 10 year estimate: Total FX risk:  5.8%  (consider medication if >/= 20%) Hip FX risk:  0.3%  (consider medication if >/= 3%)  Assessment: Osteopenia with low calcium intake and low fracture risk Dyslipidemia Obesity / Metabolic syndrome  Recommendations: 1.  Discussed BMD results and fracture risk 2.  recommend calcium 1200mg  daily through supplementation or diet.  3.   recommend weight bearing exercise - 30 minutes at least 4 days per week.  Also encouraged weight loss (goal of 15# to start) 4.  Counseled and educated about fall risk and prevention. 5.  Discussed diet - suggested Mediterranean Diet with lots of fruits and vegetables, increase whole grains and beans, limit red meat, increase fish, limit refined sugar / sweets, limit saturated and trans fat, moderate amounts of monounsaturated fat and polyunsaturated fat 6.  Start crestor 10mg  1 tablet daily - gave #14 of 20mg  and she will take 1/2 tablet daily and then fill rx given for 10mg . 7.  Recheck Lipid panel in 12 weeks  Recheck DEXA:  1 year  Time spent counseling patient:  40 minutes Cherre Robins, PharmD, CPP

## 2014-04-20 LAB — HM MAMMOGRAPHY

## 2014-04-25 ENCOUNTER — Telehealth: Payer: Self-pay | Admitting: Family Medicine

## 2014-04-25 NOTE — Telephone Encounter (Signed)
rx was sent to [facility] 03/30/14 for #30 with 2 Rf.  Called [facility] and left message that if they did not have Rx can fill Crestor 10mg  tablet dialy #30 / 2 RF

## 2014-07-01 ENCOUNTER — Ambulatory Visit (INDEPENDENT_AMBULATORY_CARE_PROVIDER_SITE_OTHER): Payer: BC Managed Care – PPO

## 2014-07-01 DIAGNOSIS — Z23 Encounter for immunization: Secondary | ICD-10-CM

## 2014-07-29 ENCOUNTER — Other Ambulatory Visit: Payer: BC Managed Care – PPO

## 2014-07-29 DIAGNOSIS — E785 Hyperlipidemia, unspecified: Secondary | ICD-10-CM

## 2014-07-29 DIAGNOSIS — M8580 Other specified disorders of bone density and structure, unspecified site: Secondary | ICD-10-CM

## 2014-07-29 DIAGNOSIS — E8881 Metabolic syndrome: Secondary | ICD-10-CM

## 2014-07-29 DIAGNOSIS — Z13820 Encounter for screening for osteoporosis: Secondary | ICD-10-CM

## 2014-07-29 DIAGNOSIS — E559 Vitamin D deficiency, unspecified: Secondary | ICD-10-CM

## 2014-07-30 LAB — BMP8+EGFR
BUN/Creatinine Ratio: 21 (ref 9–23)
BUN: 16 mg/dL (ref 6–24)
CALCIUM: 9.5 mg/dL (ref 8.7–10.2)
CO2: 26 mmol/L (ref 18–29)
CREATININE: 0.78 mg/dL (ref 0.57–1.00)
Chloride: 100 mmol/L (ref 97–108)
GFR calc Af Amer: 102 mL/min/{1.73_m2} (ref >59)
GFR calc non Af Amer: 88 mL/min/{1.73_m2} (ref >59)
Glucose: 108 mg/dL — ABNORMAL HIGH (ref 65–99)
Potassium: 4.2 mmol/L (ref 3.5–5.2)
Sodium: 141 mmol/L (ref 134–144)

## 2014-07-30 LAB — THYROID PANEL WITH TSH
Free Thyroxine Index: 2 (ref 1.2–4.9)
T3 Uptake Ratio: 24 % (ref 24–39)
T4 TOTAL: 8.2 ug/dL (ref 4.5–12.0)
TSH: 1.4 u[IU]/mL (ref 0.450–4.500)

## 2014-07-30 LAB — NMR, LIPOPROFILE
Cholesterol: 183 mg/dL (ref 100–199)
HDL Cholesterol by NMR: 54 mg/dL (ref >39)
HDL Particle Number: 39 umol/L (ref >=30.5)
LDL PARTICLE NUMBER: 1358 nmol/L — AB (ref <1000)
LDL SIZE: 20.3 nm (ref >20.5)
LDLC SERPL CALC-MCNC: 92 mg/dL (ref 0–99)
LP-IR SCORE: 61 — AB (ref <=45)
Small LDL Particle Number: 934 nmol/L — ABNORMAL HIGH (ref <=527)
Triglycerides by NMR: 187 mg/dL — ABNORMAL HIGH (ref 0–149)

## 2014-07-30 LAB — HEPATIC FUNCTION PANEL
ALBUMIN: 4.4 g/dL (ref 3.5–5.5)
ALT: 12 IU/L (ref 0–32)
AST: 17 IU/L (ref 0–40)
Alkaline Phosphatase: 79 IU/L (ref 39–117)
BILIRUBIN TOTAL: 0.4 mg/dL (ref 0.0–1.2)
Bilirubin, Direct: 0.09 mg/dL (ref 0.00–0.40)
Total Protein: 6.7 g/dL (ref 6.0–8.5)

## 2014-07-30 LAB — VITAMIN D 25 HYDROXY (VIT D DEFICIENCY, FRACTURES): Vit D, 25-Hydroxy: 42.6 ng/mL (ref 30.0–100.0)

## 2014-08-02 ENCOUNTER — Encounter: Payer: Self-pay | Admitting: Family Medicine

## 2014-08-02 ENCOUNTER — Ambulatory Visit (INDEPENDENT_AMBULATORY_CARE_PROVIDER_SITE_OTHER): Payer: BC Managed Care – PPO

## 2014-08-02 ENCOUNTER — Ambulatory Visit (INDEPENDENT_AMBULATORY_CARE_PROVIDER_SITE_OTHER): Payer: BC Managed Care – PPO | Admitting: Family Medicine

## 2014-08-02 VITALS — BP 120/78 | HR 81 | Temp 97.0°F | Ht 60.0 in | Wt 179.0 lb

## 2014-08-02 DIAGNOSIS — E8881 Metabolic syndrome: Secondary | ICD-10-CM

## 2014-08-02 DIAGNOSIS — E785 Hyperlipidemia, unspecified: Secondary | ICD-10-CM

## 2014-08-02 DIAGNOSIS — K219 Gastro-esophageal reflux disease without esophagitis: Secondary | ICD-10-CM

## 2014-08-02 DIAGNOSIS — S93402A Sprain of unspecified ligament of left ankle, initial encounter: Secondary | ICD-10-CM

## 2014-08-02 DIAGNOSIS — Z23 Encounter for immunization: Secondary | ICD-10-CM

## 2014-08-02 MED ORDER — ROSUVASTATIN CALCIUM 10 MG PO TABS: 10.0000 mg | ORAL_TABLET | Freq: Every day | ORAL | Status: DC

## 2014-08-02 MED ORDER — ESOMEPRAZOLE MAGNESIUM 40 MG PO CPDR: 40.0000 mg | DELAYED_RELEASE_CAPSULE | Freq: Every day | ORAL | Status: DC

## 2014-08-02 NOTE — Progress Notes (Addendum)
Subjective:    Patient ID: Rachel Larson, female    DOB: 1961-04-13, 53 y.o.   MRN: [id number]  HPI Pt here for follow up and management of chronic medical problems. Rachel Larson is doing well. She needs refills on 2 medications. Has no specific complaints. She is due to get a chest x-ray today. Her recent lab work will be reviewed with her today. She is also due to get her #2 hepatitis B shot and her flu shot. [place] her [place] ankle and has had swelling and pain in this ankle since that time. She indicates that she has been working on her diet more and that she does admit to needing to get more exercise.       Patient Active Problem List   Diagnosis Date Noted  . Osteopenia 03/30/2014  . Metabolic syndrome 96/22/2979  . Hyperlipidemia 01/31/2014  . Allergic rhinitis 07/22/2013  . GERD (gastroesophageal reflux disease) 07/22/2013  . Chest pain 06/26/2011  . Overweight 06/26/2011   Outpatient Encounter Prescriptions as of 08/02/2014  Medication Sig  . esomeprazole (NEXIUM) 20 MG packet Take 20 mg by mouth daily before breakfast.  . Phenylephrine-Ibuprofen (ADVIL CONGESTION RELIEF PO) Take by mouth.  . rosuvastatin (CRESTOR) 10 MG tablet Take 1 tablet (10 mg total) by mouth daily.  Marland Kitchen VITAMIN D, CHOLECALCIFEROL, PO Take by mouth. 5000 iu twice weekly    Review of Systems  Constitutional: Negative.   HENT: Negative.   Eyes: Negative.   Respiratory: Negative.   Cardiovascular: Negative.   Gastrointestinal: Negative.   Endocrine: Negative.   Genitourinary: Negative.   Musculoskeletal: Negative.   Skin: Negative.   Allergic/Immunologic: Negative.   Neurological: Negative.   Hematological: Negative.   Psychiatric/Behavioral: Negative.        Objective:   Physical Exam  Nursing note and vitals reviewed. Constitutional: She is oriented to person, place, and time. She appears well-developed and well-nourished. No distress.  The  patient is pleasant cooperative and in good spirits.  HENT:  Head: Normocephalic and atraumatic.  Right Ear: External ear normal.  Left Ear: External ear normal.  Nose: Nose normal.  Mouth/Throat: Oropharynx is clear and moist. No oropharyngeal exudate.  Eyes: Conjunctivae and EOM are normal. Pupils are equal, round, and reactive to light. Right eye exhibits no discharge. Left eye exhibits no discharge. No scleral icterus.  Neck: Normal range of motion. Neck supple. No thyromegaly present.  There is no anterior cervical adenopathy or carotid bruits  Cardiovascular: Normal rate, regular rhythm, normal heart sounds and intact distal pulses.  Exam reveals no gallop and no friction rub.   No murmur heard. At 72 per minute  Pulmonary/Chest: Effort normal and breath sounds normal. No respiratory distress. She has no wheezes. She has no rales. She exhibits no tenderness.  Abdominal: Soft. Bowel sounds are normal. She exhibits no mass. There is no tenderness. There is no rebound and no guarding.  Musculoskeletal: Normal range of motion. She exhibits tenderness. She exhibits no edema.  There is swelling and tenderness around the left lateral malleolus  Lymphadenopathy:    She has no cervical adenopathy.  Neurological: She is alert and oriented to person, place, and time. She has normal reflexes. No cranial nerve deficit.  Skin: Skin is warm and dry. No rash noted.  Psychiatric: She has a normal mood and affect. Her behavior is normal. Judgment and thought content normal.   BP 120/78  Pulse 81  Temp(Src) 97 F (  36.1 C) (Oral)  Ht 5' (1.524 m)  Wt 179 lb (81.194 kg)  BMI 34.96 kg/m2  LMP 05/17/2014  [facility] reading (PRIMARY) by  Dr.Philipe Laswell- chest X., left ankle --borderline cardiac enlargement, no abnormality of the left ankle  The patient was informed of these results before she left the office                                     Assessment & Plan:  1. Gastroesophageal reflux disease,  esophagitis presence not specified - DG Chest 2 View; Future  2. Hyperlipidemia - DG Chest 2 View; Future  3. Metabolic syndrome - DG Chest 2 View; Future  4. Left ankle sprain, initial encounter - DG Ankle Complete Left; Future  Meds ordered this encounter  Medications  . DISCONTD: rosuvastatin (CRESTOR) 10 MG tablet    Sig: Take 1 tablet (10 mg total) by mouth daily.    Dispense:  30 tablet    Refill:  6  . esomeprazole (NEXIUM) 40 MG capsule    Sig: Take 1 capsule (40 mg total) by mouth daily.    Dispense:  30 capsule    Refill:  6  . rosuvastatin (CRESTOR) 10 MG tablet    Sig: Take 1 tablet (10 mg total) by mouth daily.    Dispense:  90 tablet    Refill:  3   Patient Instructions  Continue current medications. Continue good therapeutic lifestyle changes which include good diet and exercise. Fall precautions discussed with patient. If an FOBT was given today- please return it to our front desk. If you are over 58 years old - you may need Prevnar 80 or the adult Pneumonia vaccine.  Flu Shots will be available at our office starting mid- September. Please call and schedule a FLU CLINIC APPOINTMENT.   Use warm wet compresses to left ankle and take ibuprofen as needed for pain and swelling Once your ankle pain is improved please try to get more exercise and continue to work aggressively with your diet Continue Crestor as doing Please check with your insurance regarding the Prevnar vaccine   Arrie Senate MD

## 2014-08-02 NOTE — Patient Instructions (Addendum)
Continue current medications. Continue good therapeutic lifestyle changes which include good diet and exercise. Fall precautions discussed with patient. If an FOBT was given today- please return it to our front desk. If you are over 52 years old - you may need Prevnar 45 or the adult Pneumonia vaccine.  Flu Shots will be available at our office starting mid- September. Please call and schedule a FLU CLINIC APPOINTMENT.   Use warm wet compresses to left ankle and take ibuprofen as needed for pain and swelling Once your ankle pain is improved please try to get more exercise and continue to work aggressively with your diet Continue Crestor as doing Please check with your insurance regarding the Prevnar vaccine

## 2014-08-04 NOTE — Addendum Note (Signed)
Addended by: Zannie Cove on: 08/04/2014 12:34 PM   Modules accepted: Orders

## 2014-08-05 ENCOUNTER — Ambulatory Visit: Payer: BC Managed Care – PPO

## 2014-11-02 ENCOUNTER — Ambulatory Visit: Payer: BC Managed Care – PPO | Admitting: Cardiology

## 2014-11-17 ENCOUNTER — Encounter: Payer: Self-pay | Admitting: Family Medicine

## 2014-12-06 ENCOUNTER — Ambulatory Visit: Payer: BC Managed Care – PPO | Admitting: Family Medicine

## 2015-01-02 ENCOUNTER — Ambulatory Visit (INDEPENDENT_AMBULATORY_CARE_PROVIDER_SITE_OTHER): Payer: BLUE CROSS/BLUE SHIELD | Admitting: Family Medicine

## 2015-01-02 ENCOUNTER — Encounter: Payer: Self-pay | Admitting: Family Medicine

## 2015-01-02 VITALS — BP 132/85 | HR 84 | Temp 98.1°F | Ht 60.0 in | Wt 179.0 lb

## 2015-01-02 DIAGNOSIS — E8881 Metabolic syndrome: Secondary | ICD-10-CM

## 2015-01-02 DIAGNOSIS — R5383 Other fatigue: Secondary | ICD-10-CM | POA: Diagnosis not present

## 2015-01-02 DIAGNOSIS — Z23 Encounter for immunization: Secondary | ICD-10-CM | POA: Diagnosis not present

## 2015-01-02 DIAGNOSIS — E559 Vitamin D deficiency, unspecified: Secondary | ICD-10-CM

## 2015-01-02 DIAGNOSIS — E785 Hyperlipidemia, unspecified: Secondary | ICD-10-CM

## 2015-01-02 DIAGNOSIS — R0683 Snoring: Secondary | ICD-10-CM

## 2015-01-02 DIAGNOSIS — K219 Gastro-esophageal reflux disease without esophagitis: Secondary | ICD-10-CM

## 2015-01-02 LAB — POCT CBC
Granulocyte percent: 51.7 %G (ref 37–80)
HCT, POC: 40.5 % (ref 37.7–47.9)
Hemoglobin: 12.9 g/dL (ref 12.2–16.2)
Lymph, poc: 3 (ref 0.6–3.4)
MCH, POC: 29.3 pg (ref 27–31.2)
MCHC: 31.8 g/dL (ref 31.8–35.4)
MCV: 92.1 fL (ref 80–97)
MPV: 8.1 fL (ref 0–99.8)
POC Granulocyte: 3.7 (ref 2–6.9)
POC LYMPH PERCENT: 41.5 %L (ref 10–50)
Platelet Count, POC: 206 10*3/uL (ref 142–424)
RBC: 4.4 M/uL (ref 4.04–5.48)
RDW, POC: 13 %
WBC: 7.2 10*3/uL (ref 4.6–10.2)

## 2015-01-02 NOTE — Patient Instructions (Addendum)
Continue current medications. Continue good therapeutic lifestyle changes which include good diet and exercise. Fall precautions discussed with patient. If an FOBT was given today- please return it to our front desk. If you are over 53 years old - you may need Prevnar 41 or the adult Pneumonia vaccine.  Flu Shots are still available at our office. If you still haven't had one please call to set up a nurse visit to get one.   After your visit with [place] today you will receive a survey in the mail or online from [employer] regarding your care with [place]. Please take a moment to fill this out. Your feedback is very important to [place] as you can help [place] better understand your patient needs as well as improve your experience and satisfaction. WE CARE ABOUT YOU!!!   Continue to exercise regularly drink plenty of water and watch her diet closely We'll call you with the lab work results once they become available

## 2015-01-02 NOTE — Progress Notes (Signed)
Subjective:    Patient ID: Rachel Larson, female    DOB: 1961-07-23, 54 y.o.   MRN: [id number]  HPI Pt here for follow up and management of chronic medical problems which includes hyperlipidemia and GERD. She is taking medications as directed. The patient has been getting training as a Physiological scientist and she seems to be very happy and content with her progress. She has questions about Crestor and increase blood sugar and I informed her that all statin drugs cause increased blood sugar and that they also can cause heart disease and she has an understanding that she needs to continue with her diet and exercise regimen. She does complain today of snoring shortness of breath insomnia and fatigue during the day. Her snoring is so bad that her husband has to sleep in another room. She denies chest pain GI symptoms or abdominal pain.        Patient Active Problem List   Diagnosis Date Noted  . Osteopenia 03/30/2014  . Metabolic syndrome 14/78/2956  . Hyperlipidemia 01/31/2014  . Allergic rhinitis 07/22/2013  . GERD (gastroesophageal reflux disease) 07/22/2013  . Chest pain 06/26/2011  . Overweight 06/26/2011   Outpatient Encounter Prescriptions as of 01/02/2015  Medication Sig  . esomeprazole (NEXIUM) 40 MG capsule Take 1 capsule (40 mg total) by mouth daily.  Marland Kitchen Phenylephrine-Ibuprofen (ADVIL CONGESTION RELIEF PO) Take by mouth.  . rosuvastatin (CRESTOR) 10 MG tablet Take 1 tablet (10 mg total) by mouth daily.  Marland Kitchen VITAMIN D, CHOLECALCIFEROL, PO Take by mouth. 5000 iu twice weekly    Review of Systems  Constitutional: Negative.   HENT: Negative.   Eyes: Negative.   Respiratory: Negative.   Cardiovascular: Negative.   Gastrointestinal: Negative.   Endocrine: Negative.   Genitourinary: Negative.   Musculoskeletal: Negative.   Skin: Negative.   Allergic/Immunologic: Negative.   Neurological: Negative.   Hematological: Negative.   Psychiatric/Behavioral: Negative.         Objective:   Physical Exam  Constitutional: She is oriented to person, place, and time. She appears well-developed and well-nourished. No distress.  HENT:  Head: Normocephalic and atraumatic.  Right Ear: External ear normal.  Left Ear: External ear normal.  Nose: Nose normal.  Mouth/Throat: Oropharynx is clear and moist.  Eyes: Conjunctivae and EOM are normal. Pupils are equal, round, and reactive to light. Right eye exhibits no discharge. Left eye exhibits no discharge. No scleral icterus.  Neck: Normal range of motion. Neck supple. No JVD present. No thyromegaly present.  The neck was without bruits or adenopathy or thyromegaly  Cardiovascular: Normal rate, regular rhythm, normal heart sounds and intact distal pulses.  Exam reveals no gallop and no friction rub.   No murmur heard. At 84/m  Pulmonary/Chest: Effort normal and breath sounds normal. No respiratory distress. She has no wheezes. She has no rales. She exhibits no tenderness.  Lungs were clear anteriorly and posteriorly  Abdominal: Soft. Bowel sounds are normal. She exhibits no mass. There is no tenderness. There is no rebound and no guarding.  The abdomen was nontender without masses.  Musculoskeletal: Normal range of motion. She exhibits no edema or tenderness.  Lymphadenopathy:    She has no cervical adenopathy.  Neurological: She is alert and oriented to person, place, and time. She has normal reflexes. No cranial nerve deficit.  Skin: Skin is warm and dry. No rash noted.  Psychiatric: She has a normal mood and affect. Her behavior is normal. Judgment and thought content normal.  Nursing  note and vitals reviewed.  BP 132/85 mmHg  Pulse 84  Temp(Src) 98.1 F (36.7 C) (Oral)  Ht 5' (1.524 m)  Wt 179 lb (81.194 kg)  BMI 34.96 kg/m2        Assessment & Plan:  1. Gastroesophageal reflux disease, esophagitis presence not specified -The patient is doing well with this and having no problems at the current  time. - POCT CBC; Future - Hepatic function panel; Future  2. Hyperlipidemia -The patient is taking her Crestor and is impacted will be determined with lab work  soon - POCT CBC; Future - Lipid panel; Future  3. Metabolic syndrome -She is aware that she needs to continue watching her diet, getting plenty of exercise and decreasing her weight. - POCT CBC; Future - BMP8+EGFR; Future  4. Vitamin D deficiency -She should continue her current dose of vitamin D until the current lab work is returned and the changes will be made at that time. - POCT CBC; Future - Vit D  25 hydroxy (rtn osteoporosis monitoring); Future  5. Need for vaccination against hepatitis B virus - Hepatitis B vaccine adult IM - POCT CBC; Future  6. Snoring -She may have sleep apnea. We will get a sleep apnea evaluation and workup from that point. - Ambulatory referral to Pulmonology  7. Other fatigue -Sleep apnea evaluation - Ambulatory referral to Pulmonology  No orders of the defined types were placed in this encounter.   Patient Instructions  Continue current medications. Continue good therapeutic lifestyle changes which include good diet and exercise. Fall precautions discussed with patient. If an FOBT was given today- please return it to our front desk. If you are over 54 years old - you may need Prevnar 73 or the adult Pneumonia vaccine.  Flu Shots are still available at our office. If you still haven't had one please call to set up a nurse visit to get one.   After your visit with [place] today you will receive a survey in the mail or online from [employer] regarding your care with [place]. Please take a moment to fill this out. Your feedback is very important to [place] as you can help [place] better understand your patient needs as well as improve your experience and satisfaction. WE CARE ABOUT YOU!!!   Continue to exercise regularly drink plenty of water and watch her diet closely We'll call you with the lab work  results once they become available   Arrie Senate MD

## 2015-01-03 LAB — HEPATIC FUNCTION PANEL
ALBUMIN: 4.6 g/dL (ref 3.5–5.5)
ALT: 16 IU/L (ref 0–32)
AST: 23 IU/L (ref 0–40)
Alkaline Phosphatase: 76 IU/L (ref 39–117)
Bilirubin Total: 0.4 mg/dL (ref 0.0–1.2)
Bilirubin, Direct: 0.11 mg/dL (ref 0.00–0.40)
Total Protein: 7 g/dL (ref 6.0–8.5)

## 2015-01-03 LAB — BMP8+EGFR
BUN / CREAT RATIO: 19 (ref 9–23)
BUN: 14 mg/dL (ref 6–24)
CHLORIDE: 101 mmol/L (ref 97–108)
CO2: 20 mmol/L (ref 18–29)
Calcium: 9.8 mg/dL (ref 8.7–10.2)
Creatinine, Ser: 0.74 mg/dL (ref 0.57–1.00)
GFR, EST AFRICAN AMERICAN: 108 mL/min/{1.73_m2} (ref >59)
GFR, EST NON AFRICAN AMERICAN: 93 mL/min/{1.73_m2} (ref >59)
Glucose: 96 mg/dL (ref 65–99)
POTASSIUM: 4 mmol/L (ref 3.5–5.2)
Sodium: 140 mmol/L (ref 134–144)

## 2015-01-03 LAB — LIPID PANEL
CHOL/HDL RATIO: 3.1 ratio (ref 0.0–4.4)
CHOLESTEROL TOTAL: 183 mg/dL (ref 100–199)
HDL: 59 mg/dL (ref >39)
LDL Calculated: 89 mg/dL (ref 0–99)
TRIGLYCERIDES: 173 mg/dL — AB (ref 0–149)
VLDL Cholesterol Cal: 35 mg/dL (ref 5–40)

## 2015-01-03 LAB — VITAMIN D 25 HYDROXY (VIT D DEFICIENCY, FRACTURES): Vit D, 25-Hydroxy: 46.4 ng/mL (ref 30.0–100.0)

## 2015-03-09 ENCOUNTER — Telehealth: Payer: Self-pay | Admitting: Family Medicine

## 2015-03-17 ENCOUNTER — Institutional Professional Consult (permissible substitution): Payer: Self-pay | Admitting: Internal Medicine

## 2015-03-21 ENCOUNTER — Encounter: Payer: Self-pay | Admitting: *Deleted

## 2015-05-17 ENCOUNTER — Encounter: Payer: Self-pay | Admitting: *Deleted

## 2015-06-19 ENCOUNTER — Emergency Department (HOSPITAL_COMMUNITY)
Admission: EM | Admit: 2015-06-19 | Discharge: 2015-06-19 | Disposition: A | Discharge status: Home / Self Care | Payer: BLUE CROSS/BLUE SHIELD | Attending: Emergency Medicine | Admitting: Emergency Medicine

## 2015-06-19 ENCOUNTER — Emergency Department (HOSPITAL_COMMUNITY): Payer: BLUE CROSS/BLUE SHIELD

## 2015-06-19 DIAGNOSIS — E785 Hyperlipidemia, unspecified: Secondary | ICD-10-CM | POA: Insufficient documentation

## 2015-06-19 DIAGNOSIS — Y9389 Activity, other specified: Secondary | ICD-10-CM | POA: Insufficient documentation

## 2015-06-19 DIAGNOSIS — Y998 Other external cause status: Secondary | ICD-10-CM | POA: Insufficient documentation

## 2015-06-19 DIAGNOSIS — Z79899 Other long term (current) drug therapy: Secondary | ICD-10-CM | POA: Diagnosis not present

## 2015-06-19 DIAGNOSIS — K219 Gastro-esophageal reflux disease without esophagitis: Secondary | ICD-10-CM | POA: Insufficient documentation

## 2015-06-19 DIAGNOSIS — Z8739 Personal history of other diseases of the musculoskeletal system and connective tissue: Secondary | ICD-10-CM | POA: Diagnosis not present

## 2015-06-19 DIAGNOSIS — X58XXXA Exposure to other specified factors, initial encounter: Secondary | ICD-10-CM | POA: Insufficient documentation

## 2015-06-19 DIAGNOSIS — S93401A Sprain of unspecified ligament of right ankle, initial encounter: Secondary | ICD-10-CM | POA: Diagnosis not present

## 2015-06-19 DIAGNOSIS — Z87891 Personal history of nicotine dependence: Secondary | ICD-10-CM | POA: Insufficient documentation

## 2015-06-19 DIAGNOSIS — Y929 Unspecified place or not applicable: Secondary | ICD-10-CM | POA: Insufficient documentation

## 2015-06-19 DIAGNOSIS — S99911A Unspecified injury of right ankle, initial encounter: Secondary | ICD-10-CM | POA: Diagnosis present

## 2015-06-19 MED ORDER — HYDROCODONE-ACETAMINOPHEN 5-325 MG PO TABS: 1.0000 | ORAL_TABLET | Freq: Four times a day (QID) | ORAL | Status: DC | PRN

## 2015-06-19 NOTE — ED Notes (Signed)
Pt verbalized understanding of no driving and to use caution within 4 hours of taking pain meds due to meds cause drowsiness 

## 2015-06-19 NOTE — ED Notes (Signed)
Right foot and ankle pain,  Seeing podiatrist.  This morning felt a pop and pain is worse.

## 2015-06-19 NOTE — ED Notes (Signed)
Pt with right foot and ankle pain, had steroid shot this past Thursday and pt states pain has gotten worse, also states she is taking 800 mg Ibuprofen as well for inflammation, pt states that she had a pop to foot this morning as she got up to go to bathroom

## 2015-06-19 NOTE — Discharge Instructions (Signed)
Continue to take the ibuprofen. Wear the splint for comfort. Follow up with your doctor or return here as needed.

## 2015-06-19 NOTE — ED Provider Notes (Signed)
CSN: [id number]     Arrival date & time 06/19/15  1033 History  This chart was scribed for non-physician practitioner, Debroah Baller, NP working with No att. providers found, by Erling Conte, ED Scribe. This patient was seen in room APFT24/APFT24 and the patient's care was started at 4:03 PM.    Chief Complaint  Patient presents with  . Ankle Pain    The history is provided by the patient. No language interpreter was used.    HPI Comments: Rachel Larson is a 54 y.o. female who presents to the Emergency Department complaining of constant, gradually worsening, moderate right ankle pain onset onset 3 weeks. Pt reports when she got up this morning she felt a "popping" sound in her right ankle. She states she went to her podiatrist 5 days ago and was diagnosed with plantar fascitis and given a cortisol injection. Pt notes the pain has gotten worse since the injection. She has been taking 800 mg Ibuprofen with no relief. She reports the pain is exacerbated with ambulation and putting pressure on the right foot. She denies any swelling, numbness or weakness.    Past Medical History  Diagnosis Date  . Baker cyst     rt knee  . Chest pain   . Reflux   . Hyperlipidemia    Past Surgical History  Procedure Laterality Date  . Cholecystectomy    . Nasal sinus surgery     Family History  Problem Relation Age of Onset  . Diabetes Mother   . Hypertension Brother   . Colon cancer Neg Hx    Social History  Substance Use Topics  . Smoking status: Former Smoker -- 1.00 packs/day for 18 years    Types: Cigarettes    Quit date: 07/09/1998  . Smokeless tobacco: Never Used  . Alcohol Use: Yes     Comment: occasionally   OB History    No data available     Review of Systems Negative except as stated in HPI   Allergies  Sulfonamide derivatives  Home Medications   Prior to Admission medications   Medication Sig Start Date End Date Taking? Authorizing Provider  esomeprazole (NEXIUM) 40 MG  capsule Take 1 capsule (40 mg total) by mouth daily. Patient taking differently: Take 40 mg by mouth 3 (three) times a week.  08/02/14  Yes Chipper Herb, MD  ibuprofen (ADVIL,MOTRIN) 800 MG tablet Take 800 mg by mouth every 8 (eight) hours as needed for moderate pain.   Yes Historical Provider, MD  rosuvastatin (CRESTOR) 10 MG tablet Take 1 tablet (10 mg total) by mouth daily. 08/02/14  Yes Chipper Herb, MD  VITAMIN D, CHOLECALCIFEROL, PO Take 1 tablet by mouth 2 (two) times a week.    Yes Historical Provider, MD  HYDROcodone-acetaminophen (NORCO/VICODIN) 5-325 MG per tablet Take 1 tablet by mouth every 6 (six) hours as needed. 06/19/15   Hope Bunnie Pion, NP   BP 116/69 mmHg  Pulse 83  Temp(Src) 97.9 F (36.6 C) (Oral)  Resp 16  Ht 5' (1.524 m)  Wt 179 lb (81.194 kg)  BMI 34.96 kg/m2  SpO2 96% Physical Exam  Constitutional: She is oriented to person, place, and time. She appears well-developed and well-nourished. No distress.  HENT:  Head: Normocephalic and atraumatic.  Eyes: Conjunctivae and EOM are normal.  Neck: Neck supple. No tracheal deviation present.  Cardiovascular: Normal rate.   Good pedal pulses. Good circulation, feet are warm  Pulmonary/Chest: Effort normal. No respiratory distress.  Musculoskeletal:  Normal range of motion.       Right ankle: Tenderness. Medial malleolus (and extending arch of right foot) tenderness found.  No calf pain, calf is soft, no pain with squeezing  Neurological: She is alert and oriented to person, place, and time.  Skin: Skin is warm and dry.  Psychiatric: She has a normal mood and affect. Her behavior is normal.  Nursing note and vitals reviewed.   ED Course  Procedures (including critical care time) X-ray, ice, elevation, ASO and crutches.   Labs Review Labs Reviewed - No data to display  Imaging Review Dg Ankle Complete Right  06/19/2015   CLINICAL DATA:  Right ankle and foot pain. Patient has steroid shot this past Thursday.   EXAM: RIGHT ANKLE - COMPLETE 3+ VIEW  COMPARISON:  None.  FINDINGS: There is no evidence of fracture, dislocation, or joint effusion. There is no evidence of arthropathy or other focal bone abnormality. Soft tissues are unremarkable.  IMPRESSION: Negative.   Electronically Signed   By: Abelardo Diesel M.D.   On: 06/19/2015 11:08   Dg Foot Complete Right  06/19/2015   CLINICAL DATA:  Right foot and ankle pain. Felt pop in the foot this morning.  EXAM: RIGHT FOOT COMPLETE - 3+ VIEW  COMPARISON:  Right ankle 06/19/2015  FINDINGS: There is no evidence of fracture or dislocation. There is no evidence of arthropathy or other focal bone abnormality. Soft tissues are unremarkable.  IMPRESSION: No acute abnormality.   Electronically Signed   By: Markus Daft M.D.   On: 06/19/2015 11:09    MDM  54 y.o. female with right foot and ankle pain after feeling a pop when she got up and started walking this morning. Stable for d/c without focal neuro deficits. She will follow up with ortho or her podiatrist. ASO and crutches and pain management.  Final diagnoses:  Ankle sprain, right, initial encounter   I personally performed the services described in this documentation, which was scribed in my presence. The recorded information has been reviewed and is accurate.     [address] [place], [place] 06/19/15 1608  Veryl Speak, MD 06/20/15 [phone]

## 2015-07-05 ENCOUNTER — Ambulatory Visit: Payer: BLUE CROSS/BLUE SHIELD | Admitting: Family Medicine

## 2015-07-06 ENCOUNTER — Ambulatory Visit: Payer: BLUE CROSS/BLUE SHIELD | Admitting: Family Medicine

## 2015-07-12 ENCOUNTER — Ambulatory Visit: Payer: Self-pay | Admitting: Cardiology

## 2015-07-26 ENCOUNTER — Ambulatory Visit: Payer: BLUE CROSS/BLUE SHIELD | Admitting: Family Medicine

## 2015-08-02 ENCOUNTER — Telehealth: Payer: Self-pay | Admitting: Cardiology

## 2015-08-03 NOTE — Telephone Encounter (Signed)
Close encounter 

## 2015-08-09 ENCOUNTER — Ambulatory Visit: Payer: Self-pay | Admitting: Cardiology

## 2015-08-29 ENCOUNTER — Other Ambulatory Visit: Payer: Self-pay | Admitting: Family Medicine

## 2015-09-07 ENCOUNTER — Ambulatory Visit (INDEPENDENT_AMBULATORY_CARE_PROVIDER_SITE_OTHER): Payer: BLUE CROSS/BLUE SHIELD | Admitting: Family Medicine

## 2015-09-07 ENCOUNTER — Encounter: Payer: Self-pay | Admitting: Family Medicine

## 2015-09-07 VITALS — BP 120/72 | HR 72 | Temp 97.3°F | Ht 60.0 in | Wt 182.0 lb

## 2015-09-07 DIAGNOSIS — E8881 Metabolic syndrome: Secondary | ICD-10-CM | POA: Diagnosis not present

## 2015-09-07 DIAGNOSIS — R635 Abnormal weight gain: Secondary | ICD-10-CM | POA: Diagnosis not present

## 2015-09-07 DIAGNOSIS — E559 Vitamin D deficiency, unspecified: Secondary | ICD-10-CM

## 2015-09-07 DIAGNOSIS — E785 Hyperlipidemia, unspecified: Secondary | ICD-10-CM | POA: Diagnosis not present

## 2015-09-07 DIAGNOSIS — K219 Gastro-esophageal reflux disease without esophagitis: Secondary | ICD-10-CM | POA: Diagnosis not present

## 2015-09-07 MED ORDER — ESOMEPRAZOLE MAGNESIUM 40 MG PO CPDR: 40.0000 mg | DELAYED_RELEASE_CAPSULE | Freq: Every day | ORAL | Status: DC

## 2015-09-07 NOTE — Progress Notes (Signed)
Subjective:    Patient ID: Rachel Larson, female    DOB: 1961/05/05, 54 y.o.   MRN: [id number]  HPI Pt here for follow up and management of chronic medical problems which includes hyperlipidemia. She is taking medications regularly. She also is complaining of a slight cough and congestion. She does not take flu shots and she will check with her insurance regarding the Prevnar vaccine. She is due to return an FOBT and get her lab work. She sees Dr. Jori Moll meal for her Pap and mammogram. She will schedule this herself. She is needing a refill on her Nexium which she takes for gastroesophageal reflux disease. The cough and congestion are asked to getting better. The patient denies chest pain shortness of breath trouble swallowing heartburn is not present as long as she is taking her Nexium blood in the stool black tarry bowel movements or trouble passing her water. Her eye exam is coming up soon and she will make sure that she has the ophthalmologist to send [place] a copy of that report.      Patient Active Problem List   Diagnosis Date Noted  . Osteopenia 03/30/2014  . Metabolic syndrome 34/35/6861  . Hyperlipidemia 01/31/2014  . Allergic rhinitis 07/22/2013  . GERD (gastroesophageal reflux disease) 07/22/2013  . Chest pain 06/26/2011  . Overweight(278.02) 06/26/2011   Outpatient Encounter Prescriptions as of 09/07/2015  Medication Sig  . esomeprazole (NEXIUM) 40 MG capsule Take 1 capsule (40 mg total) by mouth daily.  . rosuvastatin (CRESTOR) 10 MG tablet Take 1 tablet (10 mg total) by mouth daily.  Marland Kitchen VITAMIN D, CHOLECALCIFEROL, PO Take 1 tablet by mouth 2 (two) times a week.   . [DISCONTINUED] esomeprazole (NEXIUM) 40 MG capsule TAKE ONE CAPSULE BY MOUTH ONE TIME DAILY  . [DISCONTINUED] HYDROcodone-acetaminophen (NORCO/VICODIN) 5-325 MG per tablet Take 1 tablet by mouth every 6 (six) hours as needed.  . [DISCONTINUED] ibuprofen (ADVIL,MOTRIN) 800 MG tablet Take 800 mg by mouth every 8  (eight) hours as needed for moderate pain.   No facility-administered encounter medications on file as of 09/07/2015.      Review of Systems  Constitutional: Negative.   HENT: Positive for congestion.   Eyes: Negative.   Respiratory: Positive for cough.   Cardiovascular: Negative.   Gastrointestinal: Negative.   Endocrine: Negative.   Genitourinary: Negative.   Musculoskeletal: Negative.   Skin: Negative.   Allergic/Immunologic: Negative.   Neurological: Negative.   Hematological: Negative.   Psychiatric/Behavioral: Negative.        Objective:   Physical Exam  Constitutional: She is oriented to person, place, and time. She appears well-developed and well-nourished. No distress.  HENT:  Head: Normocephalic and atraumatic.  Right Ear: External ear normal.  Left Ear: External ear normal.  Mouth/Throat: Oropharynx is clear and moist.  Nasal congestion bilaterally left greater than right  Eyes: Conjunctivae and EOM are normal. Pupils are equal, round, and reactive to light. Right eye exhibits no discharge. Left eye exhibits no discharge. No scleral icterus.  Neck: Normal range of motion. Neck supple. No thyromegaly present.  Cardiovascular: Normal rate, regular rhythm, normal heart sounds and intact distal pulses.   No murmur heard. At 72/m  Pulmonary/Chest: Effort normal and breath sounds normal. No respiratory distress. She has no wheezes. She has no rales. She exhibits no tenderness.  The chest is clear anteriorly and posteriorly without rhonchi or wheezes  Abdominal: Soft. Bowel sounds are normal. She exhibits no mass. There is no tenderness. There is no  rebound and no guarding.  Mild obesity without masses tenderness or organ enlargement or bruits  Musculoskeletal: Normal range of motion. She exhibits no edema or tenderness.  Lymphadenopathy:    She has no cervical adenopathy.  Neurological: She is alert and oriented to person, place, and time. She has normal reflexes. No  cranial nerve deficit.  Skin: Skin is warm and dry. No rash noted.  Psychiatric: She has a normal mood and affect. Her behavior is normal. Judgment and thought content normal.  Nursing note and vitals reviewed.  BP 120/72 mmHg  Pulse 72  Temp(Src) 97.3 F (36.3 C) (Oral)  Ht 5' (1.524 m)  Wt 182 lb (82.555 kg)  BMI 35.54 kg/m2  LMP 08/19/2015        Assessment & Plan:  1. Hyperlipidemia -Continue with current treatment and more aggressive therapeutic lifestyle changes which include diet and exercise - BMP8+EGFR - CBC with Differential/Platelet - Hepatic function panel - NMR, lipoprofile  2. Gastroesophageal reflux disease, esophagitis presence not specified -Continue with Nexium and try to wean off of this and switch over to ranitidine 150 twice daily - CBC with Differential/Platelet - Hepatic function panel  3. Metabolic syndrome -Continue to work on weight by eating smaller portion sizes and not eating after dinner at nighttime. - BMP8+EGFR - CBC with Differential/Platelet  4. Vitamin D deficiency -Continue current treatment pending results of lab work - CBC with Differential/Platelet - VITAMIN D 25 Hydroxy (Vit-D Deficiency, Fractures)  5. Weight gain -Continue more aggressive therapeutic lifestyle changes with diet and exercise and even consider joining [employer] again to help with this to get a jump start on losing weight  Meds ordered this encounter  Medications  . esomeprazole (NEXIUM) 40 MG capsule    Sig: Take 1 capsule (40 mg total) by mouth daily.    Dispense:  90 capsule    Refill:  3   Patient Instructions   Continue current medications. Continue good therapeutic lifestyle changes which include good diet and exercise. Fall precautions discussed with patient. If an FOBT was given today- please return it to our front desk. If you are over 52 years old - you may need Prevnar 83 or the adult Pneumonia vaccine.  **Flu shots are available---  please call and schedule a FLU-CLINIC appointment**  After your visit with [place] today you will receive a survey in the mail or online from [employer] regarding your care with [place]. Please take a moment to fill this out. Your feedback is very important to [place] as you can help [place] better understand your patient needs as well as improve your experience and satisfaction. WE CARE ABOUT YOU!!!   Please check with your insurance regarding the Prevnar vaccine and the shingles vaccine Try to do better with diet and exercise Drink more fluids especially more water Try to reduce the use of Nexium as discussed Use Mucinex for cough and congestion plain blue and white in color and use nasal saline per direction she Keep the house as cool as possible and stay well hydrated this winter   Arrie Senate MD

## 2015-09-07 NOTE — Patient Instructions (Addendum)
Continue current medications. Continue good therapeutic lifestyle changes which include good diet and exercise. Fall precautions discussed with patient. If an FOBT was given today- please return it to our front desk. If you are over 53 years old - you may need Prevnar 5 or the adult Pneumonia vaccine.  **Flu shots are available--- please call and schedule a FLU-CLINIC appointment**  After your visit with [place] today you will receive a survey in the mail or online from [employer] regarding your care with [place]. Please take a moment to fill this out. Your feedback is very important to [place] as you can help [place] better understand your patient needs as well as improve your experience and satisfaction. WE CARE ABOUT YOU!!!   Please check with your insurance regarding the Prevnar vaccine and the shingles vaccine Try to do better with diet and exercise Drink more fluids especially more water Try to reduce the use of Nexium as discussed Use Mucinex for cough and congestion plain blue and white in color and use nasal saline per direction she Keep the house as cool as possible and stay well hydrated this winter

## 2015-09-08 LAB — CBC WITH DIFFERENTIAL/PLATELET
BASOS: 1 %
Basophils Absolute: 0 10*3/uL (ref 0.0–0.2)
EOS (ABSOLUTE): 0.2 10*3/uL (ref 0.0–0.4)
EOS: 4 %
HEMOGLOBIN: 12.3 g/dL (ref 11.1–15.9)
Hematocrit: 36.2 % (ref 34.0–46.6)
IMMATURE GRANS (ABS): 0 10*3/uL (ref 0.0–0.1)
IMMATURE GRANULOCYTES: 0 %
LYMPHS: 40 %
Lymphocytes Absolute: 2 10*3/uL (ref 0.7–3.1)
MCH: 31.9 pg (ref 26.6–33.0)
MCHC: 34 g/dL (ref 31.5–35.7)
MCV: 94 fL (ref 79–97)
MONOCYTES: 8 %
Monocytes Absolute: 0.4 10*3/uL (ref 0.1–0.9)
NEUTROS ABS: 2.4 10*3/uL (ref 1.4–7.0)
Neutrophils: 47 %
Platelets: 186 10*3/uL (ref 150–379)
RBC: 3.86 x10E6/uL (ref 3.77–5.28)
RDW: 13.7 % (ref 12.3–15.4)
WBC: 5.1 10*3/uL (ref 3.4–10.8)

## 2015-09-08 LAB — BMP8+EGFR
BUN/Creatinine Ratio: 16 (ref 9–23)
BUN: 11 mg/dL (ref 6–24)
CO2: 22 mmol/L (ref 18–29)
Calcium: 9 mg/dL (ref 8.7–10.2)
Chloride: 102 mmol/L (ref 97–106)
Creatinine, Ser: 0.67 mg/dL (ref 0.57–1.00)
GFR calc Af Amer: 117 mL/min/1.73
GFR calc non Af Amer: 101 mL/min/1.73
Glucose: 113 mg/dL — ABNORMAL HIGH (ref 65–99)
Potassium: 4 mmol/L (ref 3.5–5.2)
Sodium: 139 mmol/L (ref 136–144)

## 2015-09-08 LAB — VITAMIN D 25 HYDROXY (VIT D DEFICIENCY, FRACTURES): Vit D, 25-Hydroxy: 45.4 ng/mL (ref 30.0–100.0)

## 2015-09-08 LAB — HEPATIC FUNCTION PANEL
ALT: 7 IU/L (ref 0–32)
AST: 10 IU/L (ref 0–40)
Albumin: 4 g/dL (ref 3.5–5.5)
Alkaline Phosphatase: 70 IU/L (ref 39–117)
Bilirubin Total: 0.2 mg/dL (ref 0.0–1.2)
Bilirubin, Direct: 0.09 mg/dL (ref 0.00–0.40)
Total Protein: 6.7 g/dL (ref 6.0–8.5)

## 2015-09-08 LAB — NMR, LIPOPROFILE
CHOLESTEROL: 182 mg/dL (ref 100–199)
HDL Cholesterol by NMR: 61 mg/dL (ref >39)
HDL Particle Number: 42.6 umol/L (ref >=30.5)
LDL PARTICLE NUMBER: 1309 nmol/L — AB (ref <1000)
LDL SIZE: 20.6 nm (ref >20.5)
LDL-C: 87 mg/dL (ref 0–99)
LP-IR SCORE: 56 — AB (ref <=45)
SMALL LDL PARTICLE NUMBER: 579 nmol/L — AB (ref <=527)
Triglycerides by NMR: 168 mg/dL — ABNORMAL HIGH (ref 0–149)

## 2015-09-09 ENCOUNTER — Other Ambulatory Visit: Payer: Self-pay | Admitting: Family Medicine

## 2015-09-12 NOTE — Addendum Note (Signed)
Addended by: Thana Ates on: 09/12/2015 09:44 AM   Modules accepted: Orders

## 2015-10-05 ENCOUNTER — Ambulatory Visit (INDEPENDENT_AMBULATORY_CARE_PROVIDER_SITE_OTHER): Payer: BLUE CROSS/BLUE SHIELD | Admitting: Pediatrics

## 2015-10-05 ENCOUNTER — Encounter: Payer: Self-pay | Admitting: Pediatrics

## 2015-10-05 VITALS — BP 117/79 | HR 78 | Temp 97.0°F | Ht 60.0 in | Wt 181.0 lb

## 2015-10-05 DIAGNOSIS — R062 Wheezing: Secondary | ICD-10-CM | POA: Diagnosis not present

## 2015-10-05 MED ORDER — SPACER/AERO CHAMBER MOUTHPIECE MISC: Status: DC

## 2015-10-05 MED ORDER — ALBUTEROL SULFATE HFA 108 (90 BASE) MCG/ACT IN AERS: 2.0000 | INHALATION_SPRAY | Freq: Four times a day (QID) | RESPIRATORY_TRACT | Status: DC | PRN

## 2015-10-05 MED ORDER — AZITHROMYCIN 250 MG PO TABS: ORAL_TABLET | ORAL | Status: DC

## 2015-10-05 NOTE — Patient Instructions (Signed)
Ibuprofen 800mg  twice a day  Albuterol 2 puffs three times a day use with spacer  Start azithromycin if not starting to improve in 2-3 days

## 2015-10-05 NOTE — Progress Notes (Signed)
    Subjective:    Patient ID: Rachel Larson, female    DOB: [birth year], 54 y.o.   MRN: [id number]  CC: Cough and Chest Congestion   HPI: Rachel Larson is a 54 y.o. female presenting for Cough and Chest Congestion  Nasal congestion starting about 4 days ago Trying mucinex and afrin spray Still coughing No fevers Appetite normal  Doesn't usually have breathing problems   Depression screen [facility] 2/9 09/07/2015 01/02/2015  Decreased Interest 0 0  Down, Depressed, Hopeless 0 0  PHQ - 2 Score 0 0     Relevant past medical, surgical, family and social history reviewed and updated as indicated. Interim medical history since our last visit reviewed. Allergies and medications reviewed and updated.    ROS: Per HPI unless specifically indicated above  History  Smoking status  . Former Smoker -- 1.00 packs/day for 18 years  . Types: Cigarettes  . Quit date: 07/09/1998  Smokeless tobacco  . Never Used    Past Medical History Patient Active Problem List   Diagnosis Date Noted  . Osteopenia 03/30/2014  . Metabolic syndrome [id number]  . Hyperlipidemia 01/31/2014  . Allergic rhinitis 07/22/2013  . GERD (gastroesophageal reflux disease) 07/22/2013  . Chest pain 06/26/2011  . Overweight(278.02) 06/26/2011      Objective:    BP 117/79 mmHg  Pulse 78  Temp(Src) 97 F (36.1 C) (Oral)  Ht 5' (1.524 m)  Wt 181 lb (82.101 kg)  BMI 35.35 kg/m2  LMP 08/19/2015  Wt Readings from Last 3 Encounters:  10/05/15 181 lb (82.101 kg)  09/07/15 182 lb (82.555 kg)  06/19/15 179 lb (81.194 kg)    Gen: NAD, alert, cooperative with exam, NCAT, coughing EYES: EOMI, no scleral injection or icterus ENT:  TMs red with normal LR gray b/l, OP without erythema LYMPH: no cervical LAD CV: NRRR, normal S1/S2, no murmur, distal pulses 2+ b/l Resp: wheezes b/l, louder R side, normal WOB Abd: +BS, soft, NTND. no guarding or organomegaly Ext: No edema, warm Neuro: Alert and oriented, strength  equal b/l UE and LE, coordination grossly normal MSK: normal muscle bulk     Assessment & Plan:    Rachel Larson was seen today for cough and chest congestion, now with wheezing. Will trear as below.  Diagnoses and all orders for this visit:  Wheezing -     Spacer/Aero Chamber Mouthpiece MISC; Please dispense one spacer for use with inhaler. -     albuterol (PROVENTIL HFA;VENTOLIN HFA) 108 (90 BASE) MCG/ACT inhaler; Inhale 2 puffs into the lungs every 6 (six) hours as needed for wheezing or shortness of breath. -     azithromycin (ZITHROMAX) 250 MG tablet; Take 2 the first day and then one each day after.  Pt instructions:  Ibuprofen 800mg  twice a day  Albuterol 2 puffs three times a day use with spacer  Start azithromycin if not starting to improve in 2-3 days  Follow up plan: Return in about 4 weeks (around 11/02/2015) for breathing recheck.  Assunta Found, MD [facility] 10/05/2015, 5:05 PM

## 2015-10-06 ENCOUNTER — Telehealth: Payer: Self-pay | Admitting: Pediatrics

## 2015-10-10 NOTE — Telephone Encounter (Signed)
Rachel Larson has and is handling

## 2015-11-01 ENCOUNTER — Ambulatory Visit: Payer: Self-pay | Admitting: Cardiology

## 2015-11-30 ENCOUNTER — Telehealth: Payer: BLUE CROSS/BLUE SHIELD | Admitting: Physician Assistant

## 2015-11-30 DIAGNOSIS — J019 Acute sinusitis, unspecified: Secondary | ICD-10-CM

## 2015-11-30 DIAGNOSIS — B9689 Other specified bacterial agents as the cause of diseases classified elsewhere: Secondary | ICD-10-CM

## 2015-11-30 MED ORDER — AMOXICILLIN-POT CLAVULANATE 875-125 MG PO TABS: 1.0000 | ORAL_TABLET | Freq: Two times a day (BID) | ORAL | Status: DC

## 2015-11-30 NOTE — Progress Notes (Signed)

## 2016-03-04 DIAGNOSIS — Z6835 Body mass index (BMI) 35.0-35.9, adult: Secondary | ICD-10-CM | POA: Diagnosis not present

## 2016-03-04 DIAGNOSIS — Z1231 Encounter for screening mammogram for malignant neoplasm of breast: Secondary | ICD-10-CM | POA: Diagnosis not present

## 2016-03-04 DIAGNOSIS — Z01419 Encounter for gynecological examination (general) (routine) without abnormal findings: Secondary | ICD-10-CM | POA: Diagnosis not present

## 2016-03-12 ENCOUNTER — Encounter: Payer: Self-pay | Admitting: Family Medicine

## 2016-03-12 ENCOUNTER — Ambulatory Visit (INDEPENDENT_AMBULATORY_CARE_PROVIDER_SITE_OTHER): Payer: BLUE CROSS/BLUE SHIELD | Admitting: Family Medicine

## 2016-03-12 VITALS — BP 94/68 | HR 66 | Temp 97.1°F | Ht 60.0 in | Wt 183.0 lb

## 2016-03-12 DIAGNOSIS — E8881 Metabolic syndrome: Secondary | ICD-10-CM | POA: Diagnosis not present

## 2016-03-12 DIAGNOSIS — J301 Allergic rhinitis due to pollen: Secondary | ICD-10-CM

## 2016-03-12 DIAGNOSIS — Z0000 Encounter for general adult medical examination without abnormal findings: Secondary | ICD-10-CM

## 2016-03-12 DIAGNOSIS — Z1211 Encounter for screening for malignant neoplasm of colon: Secondary | ICD-10-CM

## 2016-03-12 DIAGNOSIS — E785 Hyperlipidemia, unspecified: Secondary | ICD-10-CM | POA: Diagnosis not present

## 2016-03-12 DIAGNOSIS — E559 Vitamin D deficiency, unspecified: Secondary | ICD-10-CM

## 2016-03-12 DIAGNOSIS — Z6835 Body mass index (BMI) 35.0-35.9, adult: Secondary | ICD-10-CM | POA: Insufficient documentation

## 2016-03-12 DIAGNOSIS — K219 Gastro-esophageal reflux disease without esophagitis: Secondary | ICD-10-CM | POA: Diagnosis not present

## 2016-03-12 LAB — URINALYSIS, COMPLETE
BILIRUBIN UA: NEGATIVE
Glucose, UA: NEGATIVE
Ketones, UA: NEGATIVE
LEUKOCYTES UA: NEGATIVE
Nitrite, UA: NEGATIVE
PH UA: 5 (ref 5.0–7.5)
PROTEIN UA: NEGATIVE
RBC UA: NEGATIVE
SPEC GRAV UA: 1.025 (ref 1.005–1.030)
Urobilinogen, Ur: 0.2 mg/dL (ref 0.2–1.0)

## 2016-03-12 LAB — MICROSCOPIC EXAMINATION
Epithelial Cells (non renal): >10 /hpf — AB (ref 0–10)
RBC, UA: NONE SEEN /hpf (ref 0–?)

## 2016-03-12 MED ORDER — ROSUVASTATIN CALCIUM 10 MG PO TABS: 10.0000 mg | ORAL_TABLET | Freq: Every day | ORAL | Status: DC

## 2016-03-12 NOTE — Patient Instructions (Addendum)
Continue current medications. Continue good therapeutic lifestyle changes which include good diet and exercise. Fall precautions discussed with patient. If an FOBT was given today- please return it to our front desk. If you are over 54 years old - you may need Prevnar 49 or the adult Pneumonia vaccine.  **Flu shots are available--- please call and schedule a FLU-CLINIC appointment**  After your visit with [place] today you will receive a survey in the mail or online from [employer] regarding your care with [place]. Please take a moment to fill this out. Your feedback is very important to [place] as you can help [place] better understand your patient needs as well as improve your experience and satisfaction. WE CARE ABOUT YOU!!!   We will call with lab results once they become available Work aggressively on diet and weight loss and watching carbs in the diet and getting more exercise you will feel much better with the weight loss small amounts over time we'll stay off.  Check with Your insurance regarding the Prevnar vaccine and the shingles shot Avoid sedating antihistamines. Allegra and Claritin are not sedating Avoid medicines that have sedating antihistamines in them area and continue with Nasacort.

## 2016-03-12 NOTE — Progress Notes (Signed)
Subjective:    Patient ID: Rachel Larson, female    DOB: January 22, 1961, 55 y.o.   MRN: [id number]  HPI Pt here for follow up and management of chronic medical problems which includes hyperlipidemia. She is taking medications regularly. The patient today complains of nasal congestion. Her BMI today is 35. She has recently had her pelvic exam and mammogram. She is due to get her DEXA scan in June or after. She will be given an FOBT to return and will get lab work today. She is going to check with her insurance regarding the Prevnar vaccine. The patient continues to have problems with allergy related congestion and it has been especially bad this winter and the spring. She is currently been taking Zyrtec though not regularly and Nasacort plus Mucinex. She denies any chest pain or shortness of breath. She has no trouble swallowing and only takes Zantac as needed for her heartburn and reflux. She denies any blood in the stool or black tarry bowel movements. Her bowels are moving normally. She is passing her water without problems with no blood in the urine and no discomfort with voiding.    Patient Active Problem List   Diagnosis Date Noted  . Osteopenia 03/30/2014  . Metabolic syndrome 12/75/1700  . Hyperlipidemia 01/31/2014  . Allergic rhinitis 07/22/2013  . GERD (gastroesophageal reflux disease) 07/22/2013  . Chest pain 06/26/2011  . Overweight(278.02) 06/26/2011   Outpatient Encounter Prescriptions as of 03/12/2016  Medication Sig  . albuterol (PROVENTIL HFA;VENTOLIN HFA) 108 (90 BASE) MCG/ACT inhaler Inhale 2 puffs into the lungs every 6 (six) hours as needed for wheezing or shortness of breath.  . CRESTOR 10 MG tablet TAKE ONE TABLET BY MOUTH ONE TIME DAILY  . ranitidine (ZANTAC) 150 MG tablet Take 150 mg by mouth 2 (two) times daily.  Marland Kitchen Spacer/Aero Chamber Mouthpiece MISC Please dispense one spacer for use with inhaler.  Marland Kitchen VITAMIN D, CHOLECALCIFEROL, PO Take 1 tablet by mouth 2 (two) times  a week.   . [DISCONTINUED] amoxicillin-clavulanate (AUGMENTIN) 875-125 MG tablet Take 1 tablet by mouth 2 (two) times daily.  . [DISCONTINUED] azithromycin (ZITHROMAX) 250 MG tablet Take 2 the first day and then one each day after.  . [DISCONTINUED] esomeprazole (NEXIUM) 40 MG capsule Take 1 capsule (40 mg total) by mouth daily.   No facility-administered encounter medications on file as of 03/12/2016.      Review of Systems  Constitutional: Negative.   HENT: Positive for congestion (nasal ).   Eyes: Negative.   Respiratory: Negative.   Cardiovascular: Negative.   Gastrointestinal: Negative.   Endocrine: Negative.   Genitourinary: Negative.   Musculoskeletal: Negative.   Skin: Negative.   Allergic/Immunologic: Negative.   Neurological: Negative.   Hematological: Negative.   Psychiatric/Behavioral: Negative.        Objective:   Physical Exam  Constitutional: She is oriented to person, place, and time. She appears well-developed and well-nourished.  HENT:  Head: Normocephalic and atraumatic.  Right Ear: External ear normal.  Left Ear: External ear normal.  Mouth/Throat: Oropharynx is clear and moist. No oropharyngeal exudate.  Nasal congestion bilaterally  Eyes: Conjunctivae and EOM are normal. Pupils are equal, round, and reactive to light. Right eye exhibits no discharge. Left eye exhibits no discharge. No scleral icterus.  Neck: Normal range of motion. Neck supple. No thyromegaly present.  No bruits thyromegaly or anterior cervical adenopathy  Cardiovascular: Normal rate, regular rhythm, normal heart sounds and intact distal pulses.   No murmur heard. The  heart has a regular rate and rhythm at 72/m  Pulmonary/Chest: Effort normal and breath sounds normal. No respiratory distress. She has no wheezes. She has no rales. She exhibits no tenderness.  Clear anteriorly and posteriorly without wheezes or rhonchi  Abdominal: Soft. Bowel sounds are normal. She exhibits no mass.  There is no tenderness. There is no rebound and no guarding.  Mild obesity with out liver or spleen enlargement and no bruits and no masses or tenderness.  Genitourinary:  The patient recently had her pelvic exam and mammogram.  Musculoskeletal: Normal range of motion. She exhibits no edema or tenderness.  Lymphadenopathy:    She has no cervical adenopathy.  Neurological: She is alert and oriented to person, place, and time. She has normal reflexes. No cranial nerve deficit.  Skin: Skin is warm and dry. No rash noted.  Psychiatric: She has a normal mood and affect. Her behavior is normal. Judgment and thought content normal.  Nursing note and vitals reviewed.  BP 94/68 mmHg  Pulse 66  Temp(Src) 97.1 F (36.2 C) (Oral)  Ht 5' (1.524 m)  Wt 183 lb (83.008 kg)  BMI 35.74 kg/m2  LMP 02/07/2016        Assessment & Plan:  1. Hyperlipidemia -Continue with aggressive therapeutic lifestyle changes and current treatment pending results of lab work - BMP8+EGFR - CBC with Differential/Platelet - Hepatic function panel - NMR, lipoprofile  2. Gastroesophageal reflux disease, esophagitis presence not specified Continue with when necessary ranitidine CBC with Differential/Platelet - Hepatic function panel  3. Vitamin D deficiency -Continue with current treatment pending results of lab work - CBC with Differential/Platelet - VITAMIN D 25 Hydroxy (Vit-D Deficiency, Fractures)  4. Metabolic syndrome -Continue with more aggressive weight loss management with diet exercise and watching carbs in the diet. - BMP8+EGFR - CBC with Differential/Platelet  5. Special screening for malignant neoplasms, colon - Fecal occult blood, imunochemical; Future  6. BMI 35.0-35.9,adult -Continue with aggressive weight loss management. Watching carbs in the diet and being more active physically  7. Allergic rhinitis due to pollen -Add Claritin to nasal steroid and continue with saline nose spray and  Mucinex and allergen avoidance   Meds ordered this encounter  Medications  . ranitidine (ZANTAC) 150 MG tablet    Sig: Take 150 mg by mouth 2 (two) times daily.  . rosuvastatin (CRESTOR) 10 MG tablet    Sig: Take 1 tablet (10 mg total) by mouth daily.    Dispense:  90 tablet    Refill:  3   Patient Instructions  Continue current medications. Continue good therapeutic lifestyle changes which include good diet and exercise. Fall precautions discussed with patient. If an FOBT was given today- please return it to our front desk. If you are over 41 years old - you may need Prevnar 4 or the adult Pneumonia vaccine.  **Flu shots are available--- please call and schedule a FLU-CLINIC appointment**  After your visit with [place] today you will receive a survey in the mail or online from [employer] regarding your care with [place]. Please take a moment to fill this out. Your feedback is very important to [place] as you can help [place] better understand your patient needs as well as improve your experience and satisfaction. WE CARE ABOUT YOU!!!   We will call with lab results once they become available Work aggressively on diet and weight loss and watching carbs in the diet and getting more exercise you will feel much better with the weight loss  small amounts over time we'll stay off.  Check with Your insurance regarding the Prevnar vaccine and the shingles shot Avoid sedating antihistamines. Allegra and Claritin are not sedating Avoid medicines that have sedating antihistamines in them area and continue with Nasacort.    Arrie Senate MD

## 2016-03-12 NOTE — Addendum Note (Signed)
Addended by: Zannie Cove on: 03/12/2016 09:00 AM   Modules accepted: Orders, SmartSet

## 2016-03-13 LAB — CBC WITH DIFFERENTIAL/PLATELET
BASOS ABS: 0 10*3/uL (ref 0.0–0.2)
BASOS: 1 %
EOS (ABSOLUTE): 0.3 10*3/uL (ref 0.0–0.4)
Eos: 7 %
HEMATOCRIT: 37.3 % (ref 34.0–46.6)
HEMOGLOBIN: 12.6 g/dL (ref 11.1–15.9)
IMMATURE GRANS (ABS): 0 10*3/uL (ref 0.0–0.1)
Immature Granulocytes: 0 %
LYMPHS ABS: 2 10*3/uL (ref 0.7–3.1)
Lymphs: 44 %
MCH: 31 pg (ref 26.6–33.0)
MCHC: 33.8 g/dL (ref 31.5–35.7)
MCV: 92 fL (ref 79–97)
MONOS ABS: 0.4 10*3/uL (ref 0.1–0.9)
Monocytes: 8 %
NEUTROS ABS: 1.8 10*3/uL (ref 1.4–7.0)
Neutrophils: 40 %
Platelets: 205 10*3/uL (ref 150–379)
RBC: 4.07 x10E6/uL (ref 3.77–5.28)
RDW: 13.5 % (ref 12.3–15.4)
WBC: 4.4 10*3/uL (ref 3.4–10.8)

## 2016-03-13 LAB — NMR, LIPOPROFILE
CHOLESTEROL: 131 mg/dL (ref 100–199)
HDL Cholesterol by NMR: 51 mg/dL (ref >39)
HDL Particle Number: 37.3 umol/L (ref >=30.5)
LDL PARTICLE NUMBER: 649 nmol/L (ref <1000)
LDL SIZE: 20.7 nm (ref >20.5)
LDL-C: 60 mg/dL (ref 0–99)
LP-IR SCORE: 54 — AB (ref <=45)
SMALL LDL PARTICLE NUMBER: 421 nmol/L (ref <=527)
Triglycerides by NMR: 101 mg/dL (ref 0–149)

## 2016-03-13 LAB — BMP8+EGFR
BUN / CREAT RATIO: 16 (ref 9–23)
BUN: 12 mg/dL (ref 6–24)
CHLORIDE: 102 mmol/L (ref 96–106)
CO2: 21 mmol/L (ref 18–29)
Calcium: 8.9 mg/dL (ref 8.7–10.2)
Creatinine, Ser: 0.77 mg/dL (ref 0.57–1.00)
GFR, EST AFRICAN AMERICAN: 102 mL/min/{1.73_m2} (ref >59)
GFR, EST NON AFRICAN AMERICAN: 88 mL/min/{1.73_m2} (ref >59)
Glucose: 113 mg/dL — ABNORMAL HIGH (ref 65–99)
POTASSIUM: 4.2 mmol/L (ref 3.5–5.2)
SODIUM: 141 mmol/L (ref 134–144)

## 2016-03-13 LAB — HEPATIC FUNCTION PANEL
ALK PHOS: 80 IU/L (ref 39–117)
ALT: 11 IU/L (ref 0–32)
AST: 17 IU/L (ref 0–40)
Albumin: 4.3 g/dL (ref 3.5–5.5)
BILIRUBIN, DIRECT: 0.12 mg/dL (ref 0.00–0.40)
Bilirubin Total: 0.3 mg/dL (ref 0.0–1.2)
Total Protein: 6.9 g/dL (ref 6.0–8.5)

## 2016-03-13 LAB — VITAMIN D 25 HYDROXY (VIT D DEFICIENCY, FRACTURES): Vit D, 25-Hydroxy: 53.9 ng/mL (ref 30.0–100.0)

## 2016-07-27 DIAGNOSIS — H524 Presbyopia: Secondary | ICD-10-CM | POA: Diagnosis not present

## 2016-07-27 DIAGNOSIS — H52222 Regular astigmatism, left eye: Secondary | ICD-10-CM | POA: Diagnosis not present

## 2016-07-27 DIAGNOSIS — H5213 Myopia, bilateral: Secondary | ICD-10-CM | POA: Diagnosis not present

## 2016-09-09 ENCOUNTER — Ambulatory Visit (INDEPENDENT_AMBULATORY_CARE_PROVIDER_SITE_OTHER): Payer: BLUE CROSS/BLUE SHIELD

## 2016-09-09 ENCOUNTER — Ambulatory Visit (INDEPENDENT_AMBULATORY_CARE_PROVIDER_SITE_OTHER): Payer: BLUE CROSS/BLUE SHIELD | Admitting: Family Medicine

## 2016-09-09 ENCOUNTER — Encounter: Payer: Self-pay | Admitting: Family Medicine

## 2016-09-09 VITALS — BP 106/65 | HR 75 | Temp 97.3°F | Ht 60.0 in | Wt 185.0 lb

## 2016-09-09 DIAGNOSIS — E559 Vitamin D deficiency, unspecified: Secondary | ICD-10-CM

## 2016-09-09 DIAGNOSIS — G629 Polyneuropathy, unspecified: Secondary | ICD-10-CM | POA: Diagnosis not present

## 2016-09-09 DIAGNOSIS — K219 Gastro-esophageal reflux disease without esophagitis: Secondary | ICD-10-CM

## 2016-09-09 DIAGNOSIS — E7800 Pure hypercholesterolemia, unspecified: Secondary | ICD-10-CM | POA: Diagnosis not present

## 2016-09-09 DIAGNOSIS — E8881 Metabolic syndrome: Secondary | ICD-10-CM

## 2016-09-09 DIAGNOSIS — N3941 Urge incontinence: Secondary | ICD-10-CM | POA: Diagnosis not present

## 2016-09-09 LAB — URINALYSIS, COMPLETE
BILIRUBIN UA: NEGATIVE
GLUCOSE, UA: NEGATIVE
KETONES UA: NEGATIVE
Leukocytes, UA: NEGATIVE
NITRITE UA: NEGATIVE
Protein, UA: NEGATIVE
SPEC GRAV UA: 1.03 (ref 1.005–1.030)
UUROB: 0.2 mg/dL (ref 0.2–1.0)
pH, UA: 5 (ref 5.0–7.5)

## 2016-09-09 LAB — MICROSCOPIC EXAMINATION: Renal Epithel, UA: NONE SEEN /hpf

## 2016-09-09 MED ORDER — ROSUVASTATIN CALCIUM 10 MG PO TABS: 10.0000 mg | ORAL_TABLET | Freq: Every day | ORAL | 3 refills | Status: DC

## 2016-09-09 NOTE — Patient Instructions (Addendum)
Continue current medications. Continue good therapeutic lifestyle changes which include good diet and exercise. Fall precautions discussed with patient. If an FOBT was given today- please return it to our front desk. If you are over 54 years old - you may need Prevnar 61 or the adult Pneumonia vaccine.  **Flu shots are available--- please call and schedule a FLU-CLINIC appointment**  After your visit with [place] today you will receive a survey in the mail or online from [employer] regarding your care with [place]. Please take a moment to fill this out. Your feedback is very important to [place] as you can help [place] better understand your patient needs as well as improve your experience and satisfaction. WE CARE ABOUT YOU!!!   The patient should take her ranitidine 150 mg more regularly for the next few weeks, 1 twice daily before breakfast and supper She should take and try Aleve 1 twice daily after breakfast and supper for the next 2-3 weeks to see if this gives her any relief as far as the neuropathy that she is having in the left neck. She should call us back in 3-4 weeks as far as how this is feeling. She should reduce her caffeine intake. She should try to walk and be more active physically. She needs to drink more water.

## 2016-09-09 NOTE — Progress Notes (Signed)
Subjective:    Patient ID: Rachel Larson, female    DOB: December 14, 1960, 55 y.o.   MRN: [id number]  HPI Pt here for follow up and management of chronic medical problems which includes gerd and hyperlipidemia. She is taking medications regularly.The patient today does complain of urinary urgency. She also complains of some numbness in her throat after eating and if her neck. She has occasional shortness of breath and did have one episode of fluttering with her heart. She is requesting a refill on Crestor. She is due to get a chest x-ray lab work today and we will also get a urinalysis because of the urgency. She had her pelvic exam Pap smear and mammogram in May. The patient is up-to-date on her eye exam. With her urinary urgency she has had one episode of incontinence. This is been going on for 6 months. She has no burning. She has a history of having 2 children. She is still having her periods but about every 6 months. She also complains of some numbness in her throat and some discomfort or tingling on the left side of her neck. This is been going on for a year but has been worse over the past 6 months. She does recall falling off the tailgate of her truck onto the pavement and this was about 3 years ago. She has shortness of breath but is thinking this may be related to inactivity. She had one episode of fluttering with her heart but she does drink caffeine and she is trying to reduce this.   Patient Active Problem List   Diagnosis Date Noted  . BMI 35.0-35.9,adult 03/12/2016  . Allergic rhinitis due to pollen 03/12/2016  . Osteopenia 03/30/2014  . Metabolic syndrome 54/65/6812  . Hyperlipidemia 01/31/2014  . Allergic rhinitis 07/22/2013  . GERD (gastroesophageal reflux disease) 07/22/2013  . Chest pain 06/26/2011  . Overweight(278.02) 06/26/2011   Outpatient Encounter Prescriptions as of 09/09/2016  Medication Sig  . ranitidine (ZANTAC) 150 MG tablet Take 150 mg by mouth 2 (two) times daily.    . rosuvastatin (CRESTOR) 10 MG tablet Take 1 tablet (10 mg total) by mouth daily.  Marland Kitchen VITAMIN D, CHOLECALCIFEROL, PO Take 1 tablet by mouth 2 (two) times a week.   Marland Kitchen albuterol (PROVENTIL HFA;VENTOLIN HFA) 108 (90 BASE) MCG/ACT inhaler Inhale 2 puffs into the lungs every 6 (six) hours as needed for wheezing or shortness of breath. (Patient not taking: Reported on 09/09/2016)  . [DISCONTINUED] Spacer/Aero Chamber Mouthpiece MISC Please dispense one spacer for use with inhaler.   No facility-administered encounter medications on file as of 09/09/2016.        Review of Systems  Constitutional: Negative.   HENT: Negative.        Numbness in throat after eating  Eyes: Negative.   Respiratory: Positive for shortness of breath.   Cardiovascular: Positive for palpitations (one occasion).  Gastrointestinal: Negative.   Endocrine: Negative.   Genitourinary: Positive for urgency.  Musculoskeletal: Negative.   Skin: Negative.   Allergic/Immunologic: Negative.   Neurological: Negative.   Hematological: Negative.   Psychiatric/Behavioral: Negative.        Objective:   Physical Exam  Constitutional: She is oriented to person, place, and time. She appears well-developed and well-nourished. No distress.  Pleasant and alert  HENT:  Head: Normocephalic and atraumatic.  Right Ear: External ear normal.  Left Ear: External ear normal.  Nose: Nose normal.  Mouth/Throat: Oropharynx is clear and moist.  Eyes: Conjunctivae and EOM are normal.  Pupils are equal, round, and reactive to light. Right eye exhibits no discharge. Left eye exhibits no discharge. No scleral icterus.  Neck: Normal range of motion. Neck supple. No thyromegaly present.  No bruits thyromegaly or anterior cervical adenopathy  Cardiovascular: Normal rate, regular rhythm, normal heart sounds and intact distal pulses.   No murmur heard. The heart has a regular rate and rhythm at 60/m  Pulmonary/Chest: Effort normal and breath  sounds normal. No respiratory distress. She has no wheezes. She has no rales.  Clear anteriorly and posteriorly  Abdominal: Soft. Bowel sounds are normal. She exhibits no mass. There is tenderness. There is no rebound and no guarding.  Slight right upper quadrant tenderness. The patient has had her gallbladder removed. She says the pain and discomfort feel somewhat like it did when she had her gallbladder removed.  Musculoskeletal: Normal range of motion. She exhibits no edema.  Lymphadenopathy:    She has no cervical adenopathy.  Neurological: She is alert and oriented to person, place, and time. She has normal reflexes. No cranial nerve deficit.  Skin: Skin is warm and dry. No rash noted.  Psychiatric: She has a normal mood and affect. Her behavior is normal. Judgment and thought content normal.  Nursing note and vitals reviewed.  BP 106/65 (BP Location: Left Arm)   Pulse 75   Temp 97.3 F (36.3 C) (Oral)   Ht 5' (1.524 m)   Wt 185 lb (83.9 kg)   LMP 08/28/2016 Comment: very irregular  SpO2 95%   BMI 36.13 kg/m   Chest x-ray and C-spine films with results pending      Assessment & Plan:  1. Pure hypercholesterolemia -Continue with aggressive therapeutic lifestyle changes and current treatment pending results of lab work - BMP8+EGFR - CBC with Differential/Platelet - Hepatic function panel - NMR, lipoprofile - DG Chest 2 View; Future  2. Gastroesophageal reflux disease, esophagitis presence not specified -Take the ranitidine 150 mg twice daily before breakfast and supper on a more regular basis especially while trying Aleve for the next 2-3 weeks. - CBC with Differential/Platelet - Hepatic function panel  3. Vitamin D deficiency -Continue current treatment pending results of lab work - CBC with Differential/Platelet - VITAMIN D 25 Hydroxy (Vit-D Deficiency, Fractures)  4. Metabolic syndrome -Continue to make every effort at losing weight and being more active  physically - BMP8+EGFR - CBC with Differential/Platelet - DG Chest 2 View; Future  5. Urge incontinence of urine -Check urinalysis and if urgency symptoms continue patient may need referral to urology for further evaluation - CBC with Differential/Platelet - Urinalysis, Complete - Urine culture  6. Neuropathy (HCC) -Try Aleve twice daily being careful not to irritate his stomach and monitoring the blood pressure closely. -The patient should call us in the next 2-3 weeks as far as any relief with the neuropathy that she is currently experiencing in her left neck and shoulder.  Patient Instructions  Continue current medications. Continue good therapeutic lifestyle changes which include good diet and exercise. Fall precautions discussed with patient. If an FOBT was given today- please return it to our front desk. If you are over 49 years old - you may need Prevnar 30 or the adult Pneumonia vaccine.  **Flu shots are available--- please call and schedule a FLU-CLINIC appointment**  After your visit with [place] today you will receive a survey in the mail or online from [employer] regarding your care with [place]. Please take a moment to fill this out. Your feedback  is very important to [place] as you can help [place] better understand your patient needs as well as improve your experience and satisfaction. WE CARE ABOUT YOU!!!   The patient should take her ranitidine 150 mg more regularly for the next few weeks, 1 twice daily before breakfast and supper She should take and try Aleve 1 twice daily after breakfast and supper for the next 2-3 weeks to see if this gives her any relief as far as the neuropathy that she is having in the left neck. She should call us back in 3-4 weeks as far as how this is feeling. She should reduce her caffeine intake. She should try to walk and be more active physically. She needs to drink more water.    Arrie Senate MD

## 2016-09-10 LAB — NMR, LIPOPROFILE
Cholesterol: 142 mg/dL (ref 100–199)
HDL CHOLESTEROL BY NMR: 54 mg/dL (ref >39)
HDL Particle Number: 39.9 umol/L (ref >=30.5)
LDL PARTICLE NUMBER: 867 nmol/L (ref <1000)
LDL SIZE: 19.9 nm (ref >20.5)
LDL-C: 61 mg/dL (ref 0–99)
LP-IR Score: 51 — ABNORMAL HIGH (ref <=45)
SMALL LDL PARTICLE NUMBER: 584 nmol/L — AB (ref <=527)
TRIGLYCERIDES BY NMR: 133 mg/dL (ref 0–149)

## 2016-09-10 LAB — CBC WITH DIFFERENTIAL/PLATELET
BASOS: 1 %
Basophils Absolute: 0 10*3/uL (ref 0.0–0.2)
EOS (ABSOLUTE): 0.2 10*3/uL (ref 0.0–0.4)
EOS: 4 %
HEMATOCRIT: 36.2 % (ref 34.0–46.6)
Hemoglobin: 12.4 g/dL (ref 11.1–15.9)
Immature Grans (Abs): 0 10*3/uL (ref 0.0–0.1)
Immature Granulocytes: 0 %
Lymphocytes Absolute: 2 10*3/uL (ref 0.7–3.1)
Lymphs: 40 %
MCH: 31.1 pg (ref 26.6–33.0)
MCHC: 34.3 g/dL (ref 31.5–35.7)
MCV: 91 fL (ref 79–97)
MONOS ABS: 0.4 10*3/uL (ref 0.1–0.9)
Monocytes: 8 %
NEUTROS ABS: 2.3 10*3/uL (ref 1.4–7.0)
Neutrophils: 47 %
PLATELETS: 197 10*3/uL (ref 150–379)
RBC: 3.99 x10E6/uL (ref 3.77–5.28)
RDW: 14 % (ref 12.3–15.4)
WBC: 4.9 10*3/uL (ref 3.4–10.8)

## 2016-09-10 LAB — BMP8+EGFR
BUN / CREAT RATIO: 19 (ref 9–23)
BUN: 14 mg/dL (ref 6–24)
CO2: 23 mmol/L (ref 18–29)
Calcium: 9.1 mg/dL (ref 8.7–10.2)
Chloride: 102 mmol/L (ref 96–106)
Creatinine, Ser: 0.73 mg/dL (ref 0.57–1.00)
GFR, EST AFRICAN AMERICAN: 109 mL/min/{1.73_m2} (ref >59)
GFR, EST NON AFRICAN AMERICAN: 94 mL/min/{1.73_m2} (ref >59)
Glucose: 107 mg/dL — ABNORMAL HIGH (ref 65–99)
POTASSIUM: 4.1 mmol/L (ref 3.5–5.2)
SODIUM: 141 mmol/L (ref 134–144)

## 2016-09-10 LAB — URINE CULTURE: ORGANISM ID, BACTERIA: NO GROWTH

## 2016-09-10 LAB — HEPATIC FUNCTION PANEL
ALT: 7 IU/L (ref 0–32)
AST: 15 IU/L (ref 0–40)
Albumin: 4.1 g/dL (ref 3.5–5.5)
Alkaline Phosphatase: 85 IU/L (ref 39–117)
BILIRUBIN, DIRECT: 0.09 mg/dL (ref 0.00–0.40)
Bilirubin Total: 0.3 mg/dL (ref 0.0–1.2)
Total Protein: 6.6 g/dL (ref 6.0–8.5)

## 2016-09-10 LAB — VITAMIN D 25 HYDROXY (VIT D DEFICIENCY, FRACTURES): VIT D 25 HYDROXY: 38.2 ng/mL (ref 30.0–100.0)

## 2016-10-31 ENCOUNTER — Ambulatory Visit (INDEPENDENT_AMBULATORY_CARE_PROVIDER_SITE_OTHER): Payer: BLUE CROSS/BLUE SHIELD | Admitting: Family

## 2016-10-31 ENCOUNTER — Encounter: Payer: Self-pay | Admitting: Family

## 2016-10-31 VITALS — BP 124/73 | HR 74 | Temp 98.3°F | Ht 60.0 in | Wt 186.0 lb

## 2016-10-31 DIAGNOSIS — R062 Wheezing: Secondary | ICD-10-CM | POA: Diagnosis not present

## 2016-10-31 DIAGNOSIS — J209 Acute bronchitis, unspecified: Secondary | ICD-10-CM

## 2016-10-31 DIAGNOSIS — J029 Acute pharyngitis, unspecified: Secondary | ICD-10-CM

## 2016-10-31 LAB — CULTURE, GROUP A STREP

## 2016-10-31 LAB — RAPID STREP SCREEN (MED CTR MEBANE ONLY): STREP GP A AG, IA W/REFLEX: NEGATIVE

## 2016-10-31 MED ORDER — BENZONATATE 200 MG PO CAPS: 200.0000 mg | ORAL_CAPSULE | Freq: Three times a day (TID) | ORAL | 1 refills | Status: DC | PRN

## 2016-10-31 MED ORDER — ALBUTEROL SULFATE HFA 108 (90 BASE) MCG/ACT IN AERS: 2.0000 | INHALATION_SPRAY | Freq: Four times a day (QID) | RESPIRATORY_TRACT | 0 refills | Status: DC | PRN

## 2016-10-31 MED ORDER — PREDNISONE 10 MG (21) PO TBPK: ORAL_TABLET | ORAL | 0 refills | Status: DC

## 2016-10-31 NOTE — Progress Notes (Signed)
   Subjective:    Patient ID: Rachel Larson, female    DOB: Jun 05, 1961, 56 y.o.   MRN: [id number]  Cough  This is a new problem. The current episode started in the past 7 days. The problem has been gradually worsening. The problem occurs every few minutes. The cough is productive of sputum. Associated symptoms include chills, headaches, nasal congestion, postnasal drip, a sore throat, shortness of breath and wheezing. Pertinent negatives include no ear congestion, ear pain, fever or myalgias. The symptoms are aggravated by lying down. She has tried rest and OTC cough suppressant for the symptoms. The treatment provided mild relief.      Review of Systems  Constitutional: Positive for chills. Negative for fever.  HENT: Positive for postnasal drip and sore throat. Negative for ear pain.   Respiratory: Positive for cough, shortness of breath and wheezing.   Musculoskeletal: Negative for myalgias.  Neurological: Positive for headaches.  All other systems reviewed and are negative.      Objective:   Physical Exam  Constitutional: She is oriented to person, place, and time. She appears well-developed and well-nourished. No distress.  HENT:  Head: Normocephalic and atraumatic.  Right Ear: External ear normal.  Left Ear: External ear normal.  Nose: Mucosal edema and rhinorrhea present.  Mouth/Throat: Posterior oropharyngeal erythema present.  Eyes: Pupils are equal, round, and reactive to light.  Neck: Normal range of motion. Neck supple. No thyromegaly present.  Cardiovascular: Normal rate, regular rhythm, normal heart sounds and intact distal pulses.   No murmur heard. Pulmonary/Chest: Effort normal and breath sounds normal. No respiratory distress. She has no wheezes.  Coarse non productive cough   Abdominal: Soft. Bowel sounds are normal. She exhibits no distension. There is no tenderness.  Musculoskeletal: Normal range of motion. She exhibits no edema or tenderness.  Neurological:  She is alert and oriented to person, place, and time.  Skin: Skin is warm and dry.  Psychiatric: She has a normal mood and affect. Her behavior is normal. Judgment and thought content normal.  Vitals reviewed.     BP 124/73   Pulse 74   Temp 98.3 F (36.8 C) (Oral)   Ht 5' (1.524 m)   Wt 186 lb (84.4 kg)   BMI 36.33 kg/m      Assessment & Plan:  1. Sore throat - Rapid strep screen (not at [facility])  2. Acute bronchitis, unspecified organism - Take meds as prescribed - Use a cool mist humidifier  -Use saline nose sprays frequently -Saline irrigations of the nose can be very helpful if done frequently.  * 4X daily for 1 week*  * Use of a nettie pot can be helpful with this. Follow directions with this* -Force fluids -For any cough or congestion  Use plain Mucinex- regular strength or max strength is fine   * Children- consult with Pharmacist for dosing -For fever or aces or pains- take tylenol or ibuprofen appropriate for age and weight.  * for fevers greater than 101 orally you may alternate ibuprofen and tylenol every  3 hours. -Throat lozenges if help -New toothbrush in 3 days - predniSONE (STERAPRED UNI-PAK 21 TAB) 10 MG (21) TBPK tablet; Use as directed  Dispense: 21 tablet; Refill: 0 - benzonatate (TESSALON) 200 MG capsule; Take 1 capsule (200 mg total) by mouth 3 (three) times daily as needed.  Dispense: 30 capsule; Refill: [place], FNP

## 2016-10-31 NOTE — Addendum Note (Signed)
Addended by: Evelina Dun A on: 10/31/2016 12:54 PM   Modules accepted: Orders

## 2016-10-31 NOTE — Patient Instructions (Signed)

## 2016-12-02 ENCOUNTER — Encounter: Payer: Self-pay | Admitting: *Deleted

## 2017-03-10 ENCOUNTER — Ambulatory Visit: Payer: BLUE CROSS/BLUE SHIELD | Admitting: Family Medicine

## 2017-03-12 ENCOUNTER — Ambulatory Visit: Payer: BLUE CROSS/BLUE SHIELD | Admitting: Family Medicine

## 2017-03-24 ENCOUNTER — Encounter: Payer: Self-pay | Admitting: Family

## 2017-03-24 ENCOUNTER — Telehealth: Payer: Self-pay | Admitting: Family Medicine

## 2017-03-24 ENCOUNTER — Ambulatory Visit (INDEPENDENT_AMBULATORY_CARE_PROVIDER_SITE_OTHER): Payer: BLUE CROSS/BLUE SHIELD | Admitting: Family

## 2017-03-24 VITALS — BP 107/77 | HR 78 | Temp 97.9°F | Ht 60.0 in | Wt 185.4 lb

## 2017-03-24 DIAGNOSIS — J019 Acute sinusitis, unspecified: Secondary | ICD-10-CM

## 2017-03-24 MED ORDER — FLUTICASONE PROPIONATE 50 MCG/ACT NA SUSP: 2.0000 | Freq: Every day | NASAL | 6 refills | Status: DC

## 2017-03-24 MED ORDER — AMOXICILLIN-POT CLAVULANATE 875-125 MG PO TABS: 1.0000 | ORAL_TABLET | Freq: Two times a day (BID) | ORAL | 0 refills | Status: DC

## 2017-03-24 NOTE — Telephone Encounter (Signed)
Left detailed message for pt to schedule appt if needs antibiotic

## 2017-03-24 NOTE — Progress Notes (Signed)
   Subjective:    Patient ID: Rachel Larson, female    DOB: 29-Mar-1961, 56 y.o.   MRN: [id number]  Sinus Problem  This is a new problem. The current episode started in the past 7 days. The problem has been waxing and waning since onset. There has been no fever. Her pain is at a severity of 10/10. The pain is moderate. Associated symptoms include chills, congestion, coughing, ear pain, headaches, a hoarse voice, sinus pressure, a sore throat and swollen glands. Pertinent negatives include no sneezing. Past treatments include spray decongestants, oral decongestants, lying down and acetaminophen. The treatment provided mild relief.      Review of Systems  Constitutional: Positive for chills.  HENT: Positive for congestion, ear pain, hoarse voice, sinus pressure and sore throat. Negative for sneezing.   Respiratory: Positive for cough.   Neurological: Positive for headaches.  All other systems reviewed and are negative.      Objective:   Physical Exam  Constitutional: She is oriented to person, place, and time. She appears well-developed and well-nourished. No distress.  HENT:  Head: Normocephalic and atraumatic.  Right Ear: External ear normal.  Nose: Mucosal edema and rhinorrhea present. Right sinus exhibits maxillary sinus tenderness and frontal sinus tenderness. Left sinus exhibits maxillary sinus tenderness and frontal sinus tenderness.  Mouth/Throat: Posterior oropharyngeal erythema present.  Eyes: Pupils are equal, round, and reactive to light.  Neck: Normal range of motion. Neck supple. No thyromegaly present.  Cardiovascular: Normal rate, regular rhythm, normal heart sounds and intact distal pulses.   No murmur heard. Pulmonary/Chest: Effort normal and breath sounds normal. No respiratory distress. She has no wheezes.  Abdominal: Soft. Bowel sounds are normal. She exhibits no distension. There is no tenderness.  Musculoskeletal: Normal range of motion. She exhibits no edema or  tenderness.  Neurological: She is alert and oriented to person, place, and time. She has normal reflexes. No cranial nerve deficit.  Skin: Skin is warm and dry.  Psychiatric: She has a normal mood and affect. Her behavior is normal. Judgment and thought content normal.  Vitals reviewed.   Temp 97.9 F (36.6 C) (Oral)   Ht 5' (1.524 m)   Wt 185 lb 6.4 oz (84.1 kg)   BMI 36.21 kg/m        Assessment & Plan:  1. Acute sinusitis, recurrence not specified, unspecified location - Take meds as prescribed - Use a cool mist humidifier  -Use saline nose sprays frequently -Saline irrigations of the nose can be very helpful if done frequently.  * 4X daily for 1 week*  * Use of a nettie pot can be helpful with this. Follow directions with this* -Force fluids -For any cough or congestion  Use plain Mucinex- regular strength or max strength is fine   * Children- consult with Pharmacist for dosing -For fever or aces or pains- take tylenol or ibuprofen appropriate for age and weight.  * for fevers greater than 101 orally you may alternate ibuprofen and tylenol every  3 hours. -Throat lozenges if help - amoxicillin-clavulanate (AUGMENTIN) 875-125 MG tablet; Take 1 tablet by mouth 2 (two) times daily.  Dispense: 14 tablet; Refill: 0 - fluticasone (FLONASE) 50 MCG/ACT nasal spray; Place 2 sprays into both nostrils daily.  Dispense: 16 g; Refill: [place], FNP

## 2017-03-24 NOTE — Patient Instructions (Signed)

## 2017-04-22 ENCOUNTER — Encounter: Payer: Self-pay | Admitting: Family Medicine

## 2017-04-22 ENCOUNTER — Ambulatory Visit (INDEPENDENT_AMBULATORY_CARE_PROVIDER_SITE_OTHER): Payer: BLUE CROSS/BLUE SHIELD | Admitting: Family Medicine

## 2017-04-22 VITALS — BP 100/63 | HR 71 | Temp 97.3°F | Ht 60.0 in | Wt 185.0 lb

## 2017-04-22 DIAGNOSIS — R0789 Other chest pain: Secondary | ICD-10-CM

## 2017-04-22 DIAGNOSIS — E8881 Metabolic syndrome: Secondary | ICD-10-CM

## 2017-04-22 DIAGNOSIS — K219 Gastro-esophageal reflux disease without esophagitis: Secondary | ICD-10-CM

## 2017-04-22 DIAGNOSIS — Z1159 Encounter for screening for other viral diseases: Secondary | ICD-10-CM

## 2017-04-22 DIAGNOSIS — R200 Anesthesia of skin: Secondary | ICD-10-CM | POA: Diagnosis not present

## 2017-04-22 DIAGNOSIS — R0600 Dyspnea, unspecified: Secondary | ICD-10-CM | POA: Diagnosis not present

## 2017-04-22 DIAGNOSIS — E7800 Pure hypercholesterolemia, unspecified: Secondary | ICD-10-CM | POA: Diagnosis not present

## 2017-04-22 DIAGNOSIS — E559 Vitamin D deficiency, unspecified: Secondary | ICD-10-CM

## 2017-04-22 DIAGNOSIS — R062 Wheezing: Secondary | ICD-10-CM

## 2017-04-22 MED ORDER — ROSUVASTATIN CALCIUM 10 MG PO TABS: 10.0000 mg | ORAL_TABLET | Freq: Every day | ORAL | 3 refills | Status: DC

## 2017-04-22 MED ORDER — ALBUTEROL SULFATE HFA 108 (90 BASE) MCG/ACT IN AERS: 2.0000 | INHALATION_SPRAY | Freq: Four times a day (QID) | RESPIRATORY_TRACT | 11 refills | Status: DC | PRN

## 2017-04-22 NOTE — Patient Instructions (Addendum)
Continue current medications. Continue good therapeutic lifestyle changes which include good diet and exercise. Fall precautions discussed with patient. If an FOBT was given today- please return it to our front desk. If you are over 55 years old - you may need Prevnar 33 or the adult Pneumonia vaccine.  **Flu shots are available--- please call and schedule a FLU-CLINIC appointment**  After your visit with [place] today you will receive a survey in the mail or online from [employer] regarding your care with [place]. Please take a moment to fill this out. Your feedback is very important to [place] as you can help [place] better understand your patient needs as well as improve your experience and satisfaction. WE CARE ABOUT YOU!!!   We will arrange for you to have an appointment with the cardiologist as soon as possible to further evaluate your chest discomfort shortness of breath and slight nausea. Discontinue caffeine use Take ranitidine or Zantac 150 mg twice daily before breakfast and supper Avoid fried foods and irritating foods If the chest discomfort continues or gets worse please go to the emergency room Get mammogram pelvic exam and DEXA scan and make sure that we get a copy of these reports Drink plenty of water and stay well hydrated We will call with lab work results as soon as those results become available

## 2017-04-22 NOTE — Addendum Note (Signed)
Addended by: Zannie Cove on: 04/22/2017 09:19 AM   Modules accepted: Orders

## 2017-04-22 NOTE — Progress Notes (Signed)
Subjective:    Patient ID: Rachel Larson, female    DOB: [birth year], 56 y.o.   MRN: [id number]  HPI Pt here for follow up and management of chronic medical problems which includes hyperlipidemia. She is taking medication regularly.The patient's past history was reviewed and her last lab work which was done in November was all excellent including cholesterol numbers. She also had a chest x-ray and C-spine films at the time and these were also within normal limits. Her last stress test was in 2012 with the cardiologist. She complains today of some weakness in the left arm and some numbness in the left face and neck at times. She is requesting refills on her albuterol inhaler and Crestor. She will schedule an appointment to get her pelvic exam Pap smear and mammogram which are all due. She is also due to get a DEXA scan. She will be given an FOBT to return and will get lab work today. The patient today complains of episodes of weakness in the left arm numbness in the left face and neck at times along with heaviness in her chest with some shortness of breath and slight nausea. This is occurring several times over the past year. It does not usually occur when she is active physically however. It seems to occur when she's at work. The last episode was yesterday and lasted from 7:30 till 11:30 yesterday morning. Other than these episodes which have occurred more frequently she denies chest pain or shortness of breath with physical activity. Specifically yesterday she did mention that she had a cup of coffee and did not have anything to eat. She is taking Zantac 150 usually once daily before breakfast. We've encouraged her in the meantime to start taking this twice a day and to discontinue the caffeine for the time being. She denies any trouble with vomiting diarrhea blood in the stool or black tarry bowel movements and her colonoscopies are up-to-date. She is also having no problems with passing her water including  burning pain or frequency but admits to not drinking enough fluids. She is due to get her pelvic exam Pap smear and DEXA scan and will get this with her gynecologist. Her maternal grandmother had congestive heart failure and died at 26 years of age. Her mom has diabetes. She does not know any history regarding her father.     Patient Active Problem List   Diagnosis Date Noted  . BMI 35.0-35.9,adult 03/12/2016  . Allergic rhinitis due to pollen 03/12/2016  . Osteopenia 03/30/2014  . Metabolic syndrome 33/35/4562  . Hyperlipidemia 01/31/2014  . Allergic rhinitis 07/22/2013  . GERD (gastroesophageal reflux disease) 07/22/2013  . Chest pain 06/26/2011  . Overweight(278.02) 06/26/2011   Outpatient Encounter Prescriptions as of 04/22/2017  Medication Sig  . albuterol (PROVENTIL HFA;VENTOLIN HFA) 108 (90 Base) MCG/ACT inhaler Inhale 2 puffs into the lungs every 6 (six) hours as needed for wheezing or shortness of breath.  . fluticasone (FLONASE) 50 MCG/ACT nasal spray Place 2 sprays into both nostrils daily.  . ranitidine (ZANTAC) 150 MG tablet Take 150 mg by mouth 2 (two) times daily.  . rosuvastatin (CRESTOR) 10 MG tablet Take 1 tablet (10 mg total) by mouth daily.  Marland Kitchen VITAMIN D, CHOLECALCIFEROL, PO Take 1 tablet by mouth 2 (two) times a week.   . [DISCONTINUED] amoxicillin-clavulanate (AUGMENTIN) 875-125 MG tablet Take 1 tablet by mouth 2 (two) times daily.   No facility-administered encounter medications on file as of 04/22/2017.      Review  of Systems  HENT: Negative.   Eyes: Negative.   Respiratory: Negative.   Cardiovascular: Negative.   Gastrointestinal: Negative.   Endocrine: Negative.   Genitourinary: Negative.   Musculoskeletal: Negative.   Skin: Negative.   Allergic/Immunologic: Negative.   Neurological: Positive for weakness (of left side - this happens randomly  ( 3-4 times in the last 6 mos)) and numbness (in face and neck at times ).  Hematological: Negative.     Psychiatric/Behavioral: Negative.        Objective:   Physical Exam  Constitutional: She is oriented to person, place, and time. She appears well-developed and well-nourished. No distress.  The patient is pleasant and alert and a good historian  HENT:  Head: Normocephalic and atraumatic.  Right Ear: External ear normal.  Left Ear: External ear normal.  Nose: Nose normal.  Mouth/Throat: Oropharynx is clear and moist. No oropharyngeal exudate.  Eyes: Conjunctivae and EOM are normal. Pupils are equal, round, and reactive to light. Right eye exhibits no discharge. Left eye exhibits no discharge. No scleral icterus.  Neck: Normal range of motion. Neck supple. No thyromegaly present.  No bruits thyromegaly or anterior cervical adenopathy  Cardiovascular: Normal rate, regular rhythm, normal heart sounds and intact distal pulses.   No murmur heard. The heart has a regular rate and rhythm at 72/m  Pulmonary/Chest: Effort normal and breath sounds normal. No respiratory distress. She has no wheezes. She has no rales.  Clear anteriorly and posteriorly  Abdominal: Soft. Bowel sounds are normal. She exhibits no mass. There is no tenderness. There is no rebound and no guarding.  No liver or spleen enlargement, no masses, no epigastric tenderness and no suprapubic tenderness  Musculoskeletal: Normal range of motion. She exhibits no edema.  Lymphadenopathy:    She has no cervical adenopathy.  Neurological: She is alert and oriented to person, place, and time. She has normal reflexes. No cranial nerve deficit.  Skin: Skin is warm and dry. No rash noted.  Psychiatric: She has a normal mood and affect. Her behavior is normal. Judgment and thought content normal.  Nursing note and vitals reviewed.  BP 100/63 (BP Location: Left Arm)   Pulse 71   Temp 97.3 F (36.3 C) (Oral)   Ht 5' (1.524 m)   Wt 185 lb (83.9 kg)   LMP 11/08/2016   BMI 36.13 kg/m    EKG with results pending====      Assessment & Plan:  1. Pure hypercholesterolemia -Continue with aggressive therapeutic lifestyle changes which include diet and exercise and current treatment pending results of lab work - CBC with Differential/Platelet - Lipid panel  2. Gastroesophageal reflux disease, esophagitis presence not specified -Avoid caffeine and increase ranitidine to 150 mg twice daily before breakfast and supper - CBC with Differential/Platelet - Hepatic function panel  3. Vitamin D deficiency -Continue current treatment pending results of lab work - CBC with Differential/Platelet - VITAMIN D 25 Hydroxy (Vit-D Deficiency, Fractures)  4. Metabolic syndrome -Make all efforts to lose weight through diet and exercise and drinking more water and eating less sugar - BMP8+EGFR - CBC with Differential/Platelet  5. Numbness - CBC with Differential/Platelet - Thyroid Panel With TSH - Vitamin B12 - EKG 12-Lead  6. Encounter for hepatitis C screening test for low risk patient - Hepatitis C antibody  7. Wheezing -Use inhaler as directed - albuterol (PROVENTIL HFA;VENTOLIN HFA) 108 (90 Base) MCG/ACT inhaler; Inhale 2 puffs into the lungs every 6 (six) hours as needed for wheezing or  shortness of breath.  Dispense: 1 Inhaler; Refill: 11  8. Chest tightness -EKG and refer to cardiology for further workup  9. Dyspnea, unspecified type -EKG her for her to cardiology for further workup  Meds ordered this encounter  Medications  . albuterol (PROVENTIL HFA;VENTOLIN HFA) 108 (90 Base) MCG/ACT inhaler    Sig: Inhale 2 puffs into the lungs every 6 (six) hours as needed for wheezing or shortness of breath.    Dispense:  1 Inhaler    Refill:  11    Please place on file  . rosuvastatin (CRESTOR) 10 MG tablet    Sig: Take 1 tablet (10 mg total) by mouth daily.    Dispense:  90 tablet    Refill:  3   Patient Instructions  Continue current medications. Continue good therapeutic lifestyle changes which include  good diet and exercise. Fall precautions discussed with patient. If an FOBT was given today- please return it to our front desk. If you are over 49 years old - you may need Prevnar 37 or the adult Pneumonia vaccine.  **Flu shots are available--- please call and schedule a FLU-CLINIC appointment**  After your visit with [place] today you will receive a survey in the mail or online from [employer] regarding your care with [place]. Please take a moment to fill this out. Your feedback is very important to [place] as you can help [place] better understand your patient needs as well as improve your experience and satisfaction. WE CARE ABOUT YOU!!!   We will arrange for you to have an appointment with the cardiologist as soon as possible to further evaluate your chest discomfort shortness of breath and slight nausea. Discontinue caffeine use Take ranitidine or Zantac 150 mg twice daily before breakfast and supper Avoid fried foods and irritating foods If the chest discomfort continues or gets worse please go to the emergency room Get mammogram pelvic exam and DEXA scan and make sure that we get a copy of these reports Drink plenty of water and stay well hydrated We will call with lab work results as soon as those results become available    Arrie Senate MD

## 2017-04-23 LAB — CBC WITH DIFFERENTIAL/PLATELET
BASOS ABS: 0 10*3/uL (ref 0.0–0.2)
Basos: 1 %
EOS (ABSOLUTE): 0.1 10*3/uL (ref 0.0–0.4)
Eos: 3 %
HEMOGLOBIN: 12.3 g/dL (ref 11.1–15.9)
Hematocrit: 37.2 % (ref 34.0–46.6)
Immature Grans (Abs): 0 10*3/uL (ref 0.0–0.1)
Immature Granulocytes: 0 %
LYMPHS ABS: 1.7 10*3/uL (ref 0.7–3.1)
LYMPHS: 41 %
MCH: 30.8 pg (ref 26.6–33.0)
MCHC: 33.1 g/dL (ref 31.5–35.7)
MCV: 93 fL (ref 79–97)
MONOCYTES: 9 %
Monocytes Absolute: 0.4 10*3/uL (ref 0.1–0.9)
Neutrophils Absolute: 2 10*3/uL (ref 1.4–7.0)
Neutrophils: 46 %
PLATELETS: 176 10*3/uL (ref 150–379)
RBC: 3.99 x10E6/uL (ref 3.77–5.28)
RDW: 14.3 % (ref 12.3–15.4)
WBC: 4.2 10*3/uL (ref 3.4–10.8)

## 2017-04-23 LAB — BMP8+EGFR
BUN / CREAT RATIO: 17 (ref 9–23)
BUN: 13 mg/dL (ref 6–24)
CALCIUM: 9 mg/dL (ref 8.7–10.2)
CHLORIDE: 102 mmol/L (ref 96–106)
CO2: 22 mmol/L (ref 20–29)
Creatinine, Ser: 0.77 mg/dL (ref 0.57–1.00)
GFR calc Af Amer: 101 mL/min/{1.73_m2} (ref >59)
GFR calc non Af Amer: 88 mL/min/{1.73_m2} (ref >59)
GLUCOSE: 109 mg/dL — AB (ref 65–99)
Potassium: 4.1 mmol/L (ref 3.5–5.2)
Sodium: 139 mmol/L (ref 134–144)

## 2017-04-23 LAB — HEPATIC FUNCTION PANEL
ALBUMIN: 4.4 g/dL (ref 3.5–5.5)
ALK PHOS: 83 IU/L (ref 39–117)
ALT: 12 IU/L (ref 0–32)
AST: 16 IU/L (ref 0–40)
BILIRUBIN TOTAL: 0.4 mg/dL (ref 0.0–1.2)
BILIRUBIN, DIRECT: 0.08 mg/dL (ref 0.00–0.40)
Total Protein: 6.6 g/dL (ref 6.0–8.5)

## 2017-04-23 LAB — HEPATITIS C ANTIBODY

## 2017-04-23 LAB — VITAMIN D 25 HYDROXY (VIT D DEFICIENCY, FRACTURES): Vit D, 25-Hydroxy: 36.1 ng/mL (ref 30.0–100.0)

## 2017-04-23 LAB — THYROID PANEL WITH TSH
Free Thyroxine Index: 1.5 (ref 1.2–4.9)
T3 Uptake Ratio: 22 % — ABNORMAL LOW (ref 24–39)
T4, Total: 7 ug/dL (ref 4.5–12.0)
TSH: 1.64 u[IU]/mL (ref 0.450–4.500)

## 2017-04-23 LAB — LIPID PANEL
Chol/HDL Ratio: 2.7 ratio (ref 0.0–4.4)
Cholesterol, Total: 145 mg/dL (ref 100–199)
HDL: 54 mg/dL (ref >39)
LDL CALC: 70 mg/dL (ref 0–99)
TRIGLYCERIDES: 107 mg/dL (ref 0–149)
VLDL Cholesterol Cal: 21 mg/dL (ref 5–40)

## 2017-04-23 LAB — VITAMIN B12: VITAMIN B 12: 372 pg/mL (ref 232–1245)

## 2017-06-02 ENCOUNTER — Encounter: Payer: Self-pay | Admitting: Family

## 2017-06-03 MED ORDER — AMOXICILLIN 500 MG PO CAPS: 500.0000 mg | ORAL_CAPSULE | Freq: Three times a day (TID) | ORAL | 0 refills | Status: DC

## 2017-06-03 NOTE — Telephone Encounter (Signed)
I spoke with patient in regards to her e-visit and gave her the contact name for billing since she was charged and the e-visit was not completed. Patient wants to know if you will please call her in an antibiotic for her sinus infection. Please advise

## 2017-06-20 ENCOUNTER — Ambulatory Visit: Payer: BLUE CROSS/BLUE SHIELD | Admitting: Cardiology

## 2017-07-10 NOTE — Progress Notes (Signed)
Cardiology Office Note   Date:  07/11/2017   ID:  Rachel Larson, DOB 08-20-1961, MRN [id number]  PCP:  Chipper Herb, MD  Cardiologist:   Minus Breeding, MD  Referring:  Chipper Herb, MD  Chief Complaint  Patient presents with  . Shortness of Breath      History of Present Illness: Rachel Larson is a 56 y.o. female who is referred by Dr. Laurance Flatten for evaluation of chest pain.   She says she gets this discomfort in her neck actually radiating posteriorly on the left side around slightly to the top anterior part of her chest and slightly into her shoulder. It's actually been going on for 3-4 years. She did have a fall at that time when she was at work hitting her head. She has some tingling at the base of her tongue numbness. She complains of shortness of breath with activity such as climbing the stairs. She does not have resting shortness of breath, PND or orthopnea. She does have some mid chest heaviness at times and it feels like his "hard to get a deep breath". The discomfort happens in her neck and chest sporadically and is not reproducible with exertion. She doesn't describe associated nausea vomiting or diaphoresis. She's not describing palpitations, presyncope or syncope. It's not like her previous reflux. She did have neck x-rays and a chest x-ray in November of last year and these were unremarkable.    I saw her in 2012 for evaluation of chest pain.  She had a POET (Plain Old Exercise Treadmill).  This was negative.    Past Medical History:  Diagnosis Date  . Baker cyst    rt knee  . Chest pain   . Hyperlipidemia   . Reflux     Past Surgical History:  Procedure Laterality Date  . CHOLECYSTECTOMY    . NASAL SINUS SURGERY       Current Outpatient Prescriptions  Medication Sig Dispense Refill  . fluticasone (FLONASE) 50 MCG/ACT nasal spray Place 2 sprays into both nostrils daily. 16 g 6  . ranitidine (ZANTAC) 150 MG tablet Take 150 mg by mouth 2 (two) times daily.     . rosuvastatin (CRESTOR) 10 MG tablet Take 1 tablet (10 mg total) by mouth daily. 90 tablet 3  . VITAMIN D, CHOLECALCIFEROL, PO Take 1 tablet by mouth 2 (two) times a week.      No current facility-administered medications for this visit.     Allergies:   Sulfonamide derivatives    Social History:  The patient  reports that she quit smoking about 19 years ago. Her smoking use included Cigarettes. She has a 18.00 pack-year smoking history. She has never used smokeless tobacco. She reports that she drinks alcohol. She reports that she does not use drugs.   Family History:  The patient's family history includes Diabetes in her mother; Diabetes type II in her brother and brother; Heart failure in her maternal grandmother; Hypertension in her brother and brother.  She has no her father's family history  ROS:  Please see the history of present illness.   Otherwise, review of systems are positive for none.   All other systems are reviewed and negative.    PHYSICAL EXAM: VS:  BP 129/86   Pulse 66   Ht 5' (1.524 m)   Wt 185 lb (83.9 kg)   BMI 36.13 kg/m  , BMI Body mass index is 36.13 kg/m. GENERAL:  Well appearing HEENT:  Pupils equal round  and reactive, fundi not visualized, oral mucosa unremarkable NECK:  No jugular venous distention, waveform within normal limits, carotid upstroke brisk and symmetric, no bruits, no thyromegaly LYMPHATICS:  No cervical, inguinal adenopathy LUNGS:  Clear to auscultation bilaterally BACK:  No CVA tenderness CHEST:  Unremarkable HEART:  PMI not displaced or sustained,S1 and S2 within normal limits, no S3, no S4, no clicks, no rubs, no murmurs ABD:  Flat, positive bowel sounds normal in frequency in pitch, no bruits, no rebound, no guarding, no midline pulsatile mass, no hepatomegaly, no splenomegaly EXT:  2 plus pulses throughout, no edema, no cyanosis no clubbing SKIN:  No rashes no nodules NEURO:  Cranial nerves II through XII grossly intact, motor  grossly intact throughout PSYCH:  Cognitively intact, oriented to person place and time    EKG:  EKG is ordered today. The ekg ordered 04/22/17 demonstrates normal sinus rhythm, rate 67, axis within normal limits, intervals within normal limits, no acute ST-T wave changes.   Recent Labs: 04/22/2017: ALT 12; BUN 13; Creatinine, Ser 0.77; Hemoglobin 12.3; Platelets 176; Potassium 4.1; Sodium 139; TSH 1.640    Lipid Panel    Component Value Date/Time   CHOL 145 04/22/2017 0907   TRIG 107 04/22/2017 0907   TRIG 133 09/09/2016 0932   HDL 54 04/22/2017 0907   HDL 54 09/09/2016 0932   CHOLHDL 2.7 04/22/2017 0907   LDLCALC 70 04/22/2017 0907   LDLCALC 92 07/29/2014 1057      Wt Readings from Last 3 Encounters:  07/11/17 185 lb (83.9 kg)  04/22/17 185 lb (83.9 kg)  03/24/17 185 lb 6.4 oz (84.1 kg)      Other studies Reviewed: Additional studies/ records that were reviewed today include: Labs, Xrays. Review of the above records demonstrates:  Please see elsewhere in the note.     ASSESSMENT AND PLAN:   CHEST PAIN:  The chest pain is atypical.  I will bring the patient back for a POET (Plain Old Exercise Test). This will allow me to screen for obstructive coronary disease, risk stratify and very importantly provide a prescription for exercise.  DYSPNEA:  Her sats were 94% even after we walked her up several flights of stairs.  However, the HR went up to the 130s.  We will assess this with the POET (Plain Old Exercise Treadmill)  Current medicines are reviewed at length with the patient today.  The patient does not have concerns regarding medicines.  The following changes have been made:  no change  Labs/ tests ordered today include:   Orders Placed This Encounter  Procedures  . Exercise Tolerance Test     Disposition:   FU with me as needed.      Signed, Minus Breeding, MD  07/11/2017 10:32 AM    [facility]

## 2017-07-11 ENCOUNTER — Encounter: Payer: Self-pay | Admitting: Cardiology

## 2017-07-11 ENCOUNTER — Ambulatory Visit (INDEPENDENT_AMBULATORY_CARE_PROVIDER_SITE_OTHER): Payer: BLUE CROSS/BLUE SHIELD | Admitting: Cardiology

## 2017-07-11 VITALS — BP 129/86 | HR 66 | Ht 60.0 in | Wt 185.0 lb

## 2017-07-11 DIAGNOSIS — R079 Chest pain, unspecified: Secondary | ICD-10-CM

## 2017-07-11 DIAGNOSIS — R0602 Shortness of breath: Secondary | ICD-10-CM

## 2017-07-11 NOTE — Patient Instructions (Signed)
Medication Instructions:  Continue current medications  If you need a refill on your cardiac medications before your next appointment, please call your pharmacy.  Labwork: None Ordered   Testing/Procedures: Your physician has requested that you have an exercise tolerance test. For further information please visit [email or web address]. Please also follow instruction sheet, as given.   Follow-Up: Your physician wants you to follow-up in: As Needed.     Thank you for choosing [facility]!!       

## 2017-07-18 ENCOUNTER — Telehealth (HOSPITAL_COMMUNITY): Payer: Self-pay

## 2017-07-18 NOTE — Telephone Encounter (Signed)
Encounter complete. 

## 2017-07-23 ENCOUNTER — Ambulatory Visit (HOSPITAL_COMMUNITY)
Admission: RE | Admit: 2017-07-23 | Discharge: 2017-07-23 | Disposition: A | Discharge status: Home / Self Care | Payer: BLUE CROSS/BLUE SHIELD | Source: Ambulatory Visit | Attending: Cardiovascular Disease | Admitting: Cardiovascular Disease

## 2017-07-23 DIAGNOSIS — R0602 Shortness of breath: Secondary | ICD-10-CM

## 2017-07-23 LAB — EXERCISE TOLERANCE TEST
CSEPED: 7 min
CSEPEDS: 52 s
CSEPEW: 9.8 METS
CSEPHR: 97 %
CSEPPHR: 162 {beats}/min
MPHR: 166 {beats}/min
RPE: 17
Rest HR: 75 {beats}/min

## 2017-07-24 ENCOUNTER — Other Ambulatory Visit: Payer: Self-pay | Admitting: Cardiology

## 2017-07-24 DIAGNOSIS — R0602 Shortness of breath: Secondary | ICD-10-CM

## 2017-07-24 LAB — EXERCISE TOLERANCE TEST
CHL CUP MPHR: 166 {beats}/min
CHL CUP RESTING HR STRESS: 75 {beats}/min
CSEPPHR: 162 {beats}/min
Estimated workload: 9.8 METS
Exercise duration (min): 7 min
Exercise duration (sec): 52 s
Percent HR: 97 %
RPE: 17

## 2017-08-25 ENCOUNTER — Encounter: Payer: Self-pay | Admitting: Family Medicine

## 2017-08-25 ENCOUNTER — Ambulatory Visit (INDEPENDENT_AMBULATORY_CARE_PROVIDER_SITE_OTHER): Payer: BLUE CROSS/BLUE SHIELD | Admitting: Family Medicine

## 2017-08-25 VITALS — BP 117/67 | HR 77 | Temp 101.5°F | Ht 60.0 in | Wt 185.0 lb

## 2017-08-25 DIAGNOSIS — J189 Pneumonia, unspecified organism: Secondary | ICD-10-CM | POA: Diagnosis not present

## 2017-08-25 LAB — VERITOR FLU A/B WAIVED
INFLUENZA A: NEGATIVE
INFLUENZA B: NEGATIVE

## 2017-08-25 MED ORDER — CEFTRIAXONE SODIUM 1 G IJ SOLR
1.0000 g | Freq: Once | INTRAMUSCULAR | Status: AC
  Administered 2017-08-25: 1 g via INTRAMUSCULAR

## 2017-08-25 MED ORDER — ALBUTEROL SULFATE HFA 108 (90 BASE) MCG/ACT IN AERS: 2.0000 | INHALATION_SPRAY | Freq: Four times a day (QID) | RESPIRATORY_TRACT | 5 refills | Status: DC | PRN

## 2017-08-25 MED ORDER — AZITHROMYCIN 250 MG PO TABS: ORAL_TABLET | ORAL | 0 refills | Status: DC

## 2017-08-25 NOTE — Progress Notes (Signed)
BP 117/67   Pulse 77   Temp (!) 101.5 F (38.6 C) (Oral)   Ht 5' (1.524 m)   Wt 185 lb (83.9 kg)   SpO2 95%   BMI 36.13 kg/m    Subjective:    Patient ID: Rachel Larson, female    DOB: 03-Apr-1961, 56 y.o.   MRN: [id number]  HPI: Gianni Fuchs is a 56 y.o. female presenting on 08/25/2017 for Bronchitis (cough, wheezing, shortness of breath, fever; symptoms began about 4 days ago; had Albuterol inhaler that expired 3 months ago but did use it this weekend and took Delsym)   HPI Cough wheezing and shortness of breath and fever Patient comes in complaining of 3 days worth of cough and wheezing and shortness of breath and fever that have been bothering her.  She has an albuterol inhaler that expired and has been using that which has been helping some and she has been taking Delsym which helps some as well.  She says she is short of breath even sitting because she feels tight in her chest and feels wheezing but is especially worse when she is laying down.  She denies any swelling.  Her cough is productive of yellow-green sputum.  Her fever today in the office is 101.5 and she says that her fever started just yesterday.  She denies any sick contacts that she knows of.  The coughing and wheezing has been keeping her up at night.  Relevant past medical, surgical, family and social history reviewed and updated as indicated. Interim medical history since our last visit reviewed. Allergies and medications reviewed and updated.  Review of Systems  Constitutional: Positive for chills and fever.  HENT: Positive for congestion, postnasal drip, rhinorrhea, sinus pressure, sneezing and sore throat. Negative for ear discharge and ear pain.   Eyes: Negative for pain, redness and visual disturbance.  Respiratory: Positive for shortness of breath and wheezing. Negative for chest tightness.   Cardiovascular: Negative for chest pain and leg swelling.  Genitourinary: Negative for difficulty urinating and  dysuria.  Musculoskeletal: Positive for myalgias. Negative for back pain and gait problem.  Skin: Negative for rash.  Neurological: Negative for light-headedness and headaches.  Psychiatric/Behavioral: Negative for agitation and behavioral problems.  All other systems reviewed and are negative.  Per HPI unless specifically indicated above     Objective:    BP 117/67   Pulse 77   Temp (!) 101.5 F (38.6 C) (Oral)   Ht 5' (1.524 m)   Wt 185 lb (83.9 kg)   SpO2 95%   BMI 36.13 kg/m   Wt Readings from Last 3 Encounters:  08/25/17 185 lb (83.9 kg)  07/11/17 185 lb (83.9 kg)  04/22/17 185 lb (83.9 kg)    Physical Exam  Constitutional: She is oriented to person, place, and time. She appears well-developed and well-nourished. No distress.  HENT:  Right Ear: Tympanic membrane, external ear and ear canal normal.  Left Ear: Tympanic membrane, external ear and ear canal normal.  Nose: Mucosal edema and rhinorrhea present. No epistaxis. Right sinus exhibits no maxillary sinus tenderness and no frontal sinus tenderness. Left sinus exhibits no maxillary sinus tenderness and no frontal sinus tenderness.  Mouth/Throat: Uvula is midline and mucous membranes are normal. Posterior oropharyngeal edema and posterior oropharyngeal erythema present. No oropharyngeal exudate or tonsillar abscesses.  Eyes: Conjunctivae and EOM are normal.  Neck: Neck supple. No thyromegaly present.  Cardiovascular: Normal rate, regular rhythm, normal heart sounds and intact distal pulses.  No murmur heard. Pulmonary/Chest: Effort normal and breath sounds normal. No respiratory distress. She has no wheezes. She has no rales.  Musculoskeletal: Normal range of motion. She exhibits no edema or tenderness.  Lymphadenopathy:    She has no cervical adenopathy.  Neurological: She is alert and oriented to person, place, and time. Coordination normal.  Skin: Skin is warm and dry. No rash noted. She is not diaphoretic.   Psychiatric: She has a normal mood and affect. Her behavior is normal.  Vitals reviewed.  Rapid flu: Negative    Assessment & Plan:   Problem List Items Addressed This Visit    None    Visit Diagnoses    Atypical pneumonia    -  Primary   Relevant Medications   azithromycin (ZITHROMAX) 250 MG tablet   cefTRIAXone (ROCEPHIN) injection 1 g (Start on 08/25/2017  3:30 PM)   Other Relevant Orders   Veritor Flu A/B Waived      Follow up plan: Return if symptoms worsen or fail to improve.  Counseling provided for all of the vaccine components Orders Placed This Encounter  Procedures  . Veritor Flu A/B [place], MD [facility] 08/25/2017, 2:44 PM

## 2017-09-17 DIAGNOSIS — Z01419 Encounter for gynecological examination (general) (routine) without abnormal findings: Secondary | ICD-10-CM | POA: Diagnosis not present

## 2017-09-17 DIAGNOSIS — Z13820 Encounter for screening for osteoporosis: Secondary | ICD-10-CM | POA: Diagnosis not present

## 2017-09-17 DIAGNOSIS — Z6836 Body mass index (BMI) 36.0-36.9, adult: Secondary | ICD-10-CM | POA: Diagnosis not present

## 2017-09-17 DIAGNOSIS — Z1231 Encounter for screening mammogram for malignant neoplasm of breast: Secondary | ICD-10-CM | POA: Diagnosis not present

## 2017-09-23 ENCOUNTER — Other Ambulatory Visit: Payer: Self-pay | Admitting: Obstetrics & Gynecology

## 2017-09-23 DIAGNOSIS — R928 Other abnormal and inconclusive findings on diagnostic imaging of breast: Secondary | ICD-10-CM

## 2017-09-24 ENCOUNTER — Ambulatory Visit
Admission: RE | Admit: 2017-09-24 | Discharge: 2017-09-24 | Disposition: A | Discharge status: Home / Self Care | Payer: BLUE CROSS/BLUE SHIELD | Source: Ambulatory Visit | Attending: Obstetrics & Gynecology | Admitting: Obstetrics & Gynecology

## 2017-09-24 ENCOUNTER — Ambulatory Visit: Payer: BLUE CROSS/BLUE SHIELD

## 2017-09-24 DIAGNOSIS — R922 Inconclusive mammogram: Secondary | ICD-10-CM | POA: Diagnosis not present

## 2017-09-24 DIAGNOSIS — R928 Other abnormal and inconclusive findings on diagnostic imaging of breast: Secondary | ICD-10-CM

## 2017-10-29 ENCOUNTER — Ambulatory Visit: Payer: BLUE CROSS/BLUE SHIELD | Admitting: Family Medicine

## 2017-11-05 ENCOUNTER — Emergency Department (HOSPITAL_COMMUNITY): Payer: BLUE CROSS/BLUE SHIELD

## 2017-11-05 ENCOUNTER — Encounter (HOSPITAL_COMMUNITY): Payer: Self-pay | Admitting: Emergency Medicine

## 2017-11-05 ENCOUNTER — Other Ambulatory Visit: Payer: Self-pay

## 2017-11-05 ENCOUNTER — Emergency Department (HOSPITAL_COMMUNITY)
Admission: EM | Admit: 2017-11-05 | Discharge: 2017-11-05 | Disposition: A | Discharge status: Home / Self Care | Payer: BLUE CROSS/BLUE SHIELD | Attending: Emergency Medicine | Admitting: Emergency Medicine

## 2017-11-05 DIAGNOSIS — M542 Cervicalgia: Secondary | ICD-10-CM | POA: Diagnosis not present

## 2017-11-05 DIAGNOSIS — S3992XA Unspecified injury of lower back, initial encounter: Secondary | ICD-10-CM | POA: Diagnosis not present

## 2017-11-05 DIAGNOSIS — M545 Low back pain, unspecified: Secondary | ICD-10-CM

## 2017-11-05 DIAGNOSIS — R202 Paresthesia of skin: Secondary | ICD-10-CM | POA: Diagnosis not present

## 2017-11-05 DIAGNOSIS — Z79899 Other long term (current) drug therapy: Secondary | ICD-10-CM | POA: Diagnosis not present

## 2017-11-05 DIAGNOSIS — Z87891 Personal history of nicotine dependence: Secondary | ICD-10-CM | POA: Insufficient documentation

## 2017-11-05 DIAGNOSIS — S199XXA Unspecified injury of neck, initial encounter: Secondary | ICD-10-CM | POA: Diagnosis not present

## 2017-11-05 MED ORDER — CYCLOBENZAPRINE HCL 5 MG PO TABS: 5.0000 mg | ORAL_TABLET | Freq: Three times a day (TID) | ORAL | 0 refills | Status: DC | PRN

## 2017-11-05 MED ORDER — HYDROCODONE-ACETAMINOPHEN 5-325 MG PO TABS: 1.0000 | ORAL_TABLET | ORAL | 0 refills | Status: DC | PRN

## 2017-11-05 MED ORDER — HYDROCODONE-ACETAMINOPHEN 5-325 MG PO TABS
1.0000 | ORAL_TABLET | Freq: Once | ORAL | Status: AC
  Administered 2017-11-05: 1 via ORAL
  Filled 2017-11-05: qty 1

## 2017-11-05 MED ORDER — IBUPROFEN 600 MG PO TABS: 600.0000 mg | ORAL_TABLET | Freq: Four times a day (QID) | ORAL | 0 refills | Status: DC | PRN
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
## 2017-11-15 ENCOUNTER — Encounter: Payer: Self-pay | Admitting: Family Medicine

## 2017-11-26 ENCOUNTER — Encounter: Payer: Self-pay | Admitting: Family Medicine

## 2017-11-26 ENCOUNTER — Ambulatory Visit (INDEPENDENT_AMBULATORY_CARE_PROVIDER_SITE_OTHER): Payer: BLUE CROSS/BLUE SHIELD

## 2017-11-26 ENCOUNTER — Ambulatory Visit: Payer: BLUE CROSS/BLUE SHIELD | Admitting: Family Medicine

## 2017-11-26 VITALS — BP 116/72 | HR 75 | Temp 97.2°F | Ht 60.0 in | Wt 188.0 lb

## 2017-11-26 DIAGNOSIS — R202 Paresthesia of skin: Secondary | ICD-10-CM | POA: Diagnosis not present

## 2017-11-26 DIAGNOSIS — E559 Vitamin D deficiency, unspecified: Secondary | ICD-10-CM | POA: Diagnosis not present

## 2017-11-26 DIAGNOSIS — S134XXA Sprain of ligaments of cervical spine, initial encounter: Secondary | ICD-10-CM

## 2017-11-26 DIAGNOSIS — R0600 Dyspnea, unspecified: Secondary | ICD-10-CM

## 2017-11-26 DIAGNOSIS — K219 Gastro-esophageal reflux disease without esophagitis: Secondary | ICD-10-CM | POA: Diagnosis not present

## 2017-11-26 DIAGNOSIS — E7800 Pure hypercholesterolemia, unspecified: Secondary | ICD-10-CM

## 2017-11-26 DIAGNOSIS — R0602 Shortness of breath: Secondary | ICD-10-CM | POA: Diagnosis not present

## 2017-11-26 DIAGNOSIS — E8881 Metabolic syndrome: Secondary | ICD-10-CM | POA: Diagnosis not present

## 2017-11-26 DIAGNOSIS — M549 Dorsalgia, unspecified: Secondary | ICD-10-CM

## 2017-11-26 DIAGNOSIS — R200 Anesthesia of skin: Secondary | ICD-10-CM | POA: Diagnosis not present

## 2017-11-26 MED ORDER — ROSUVASTATIN CALCIUM 10 MG PO TABS: 10.0000 mg | ORAL_TABLET | Freq: Every day | ORAL | 3 refills | Status: DC

## 2017-11-26 NOTE — Patient Instructions (Addendum)
Continue current medications. Continue good therapeutic lifestyle changes which include good diet and exercise. Fall precautions discussed with patient. If an FOBT was given today- please return it to our front desk. If you are over 56 years old - you may need Prevnar 24 or the adult Pneumonia vaccine.  **Flu shots are available--- please call and schedule a FLU-CLINIC appointment**  After your visit with [place] today you will receive a survey in the mail or online from [employer] regarding your care with [place]. Please take a moment to fill this out. Your feedback is very important to [place] as you can help [place] better understand your patient needs as well as improve your experience and satisfaction. WE CARE ABOUT YOU!!!   Try taking the 600 mg ibuprofen once daily after your biggest meal and do this for a couple of weeks for the first week of taking 600 mg of ibuprofen also take 200 mg of ibuprofen over-the-counter after supper.  Only take these if your stomach is not bothering you.  Make sure you take your ranitidine before breakfast and supper in the NSAID after breakfast and supper. When possible use some warm wet compresses to the posterior neck and upper back areas. After 1 week of taking 600 mg of ibuprofen and 200 in the evening reduce the ibuprofen to just 600 in the morning after breakfast and take extra strength Tylenol at night We will call with the results of the chest x-ray when those results become available Use warm wet compresses to posterior neck for 20 minutes 3 or 4 times daily if possible
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
## 2017-11-27 LAB — HEPATIC FUNCTION PANEL
ALT: 18 IU/L (ref 0–32)
AST: 20 IU/L (ref 0–40)
Albumin: 4.7 g/dL (ref 3.5–5.5)
Alkaline Phosphatase: 84 IU/L (ref 39–117)
BILIRUBIN TOTAL: 0.4 mg/dL (ref 0.0–1.2)
Bilirubin, Direct: 0.13 mg/dL (ref 0.00–0.40)
Total Protein: 7.3 g/dL (ref 6.0–8.5)

## 2017-11-27 LAB — CBC WITH DIFFERENTIAL/PLATELET
BASOS: 1 %
Basophils Absolute: 0 10*3/uL (ref 0.0–0.2)
EOS (ABSOLUTE): 0.2 10*3/uL (ref 0.0–0.4)
EOS: 3 %
HEMOGLOBIN: 13.3 g/dL (ref 11.1–15.9)
Hematocrit: 38.1 % (ref 34.0–46.6)
IMMATURE GRANULOCYTES: 0 %
Immature Grans (Abs): 0 10*3/uL (ref 0.0–0.1)
LYMPHS ABS: 2.9 10*3/uL (ref 0.7–3.1)
Lymphs: 43 %
MCH: 31.7 pg (ref 26.6–33.0)
MCHC: 34.9 g/dL (ref 31.5–35.7)
MCV: 91 fL (ref 79–97)
MONOS ABS: 0.5 10*3/uL (ref 0.1–0.9)
Monocytes: 8 %
NEUTROS PCT: 45 %
Neutrophils Absolute: 2.9 10*3/uL (ref 1.4–7.0)
Platelets: 194 10*3/uL (ref 150–379)
RBC: 4.19 x10E6/uL (ref 3.77–5.28)
RDW: 13.7 % (ref 12.3–15.4)
WBC: 6.5 10*3/uL (ref 3.4–10.8)

## 2017-11-27 LAB — BMP8+EGFR
BUN / CREAT RATIO: 17 (ref 9–23)
BUN: 12 mg/dL (ref 6–24)
CO2: 23 mmol/L (ref 20–29)
CREATININE: 0.72 mg/dL (ref 0.57–1.00)
Calcium: 9.3 mg/dL (ref 8.7–10.2)
Chloride: 103 mmol/L (ref 96–106)
GFR calc Af Amer: 109 mL/min/{1.73_m2} (ref >59)
GFR, EST NON AFRICAN AMERICAN: 95 mL/min/{1.73_m2} (ref >59)
Glucose: 91 mg/dL (ref 65–99)
Potassium: 4.1 mmol/L (ref 3.5–5.2)
SODIUM: 141 mmol/L (ref 134–144)

## 2017-11-27 LAB — LIPID PANEL
Chol/HDL Ratio: 2.6 ratio (ref 0.0–4.4)
Cholesterol, Total: 148 mg/dL (ref 100–199)
HDL: 57 mg/dL (ref >39)
LDL Calculated: 64 mg/dL (ref 0–99)
Triglycerides: 137 mg/dL (ref 0–149)
VLDL Cholesterol Cal: 27 mg/dL (ref 5–40)

## 2017-11-27 LAB — THYROID PANEL WITH TSH
Free Thyroxine Index: 1.9 (ref 1.2–4.9)
T3 UPTAKE RATIO: 22 % — AB (ref 24–39)
T4 TOTAL: 8.6 ug/dL (ref 4.5–12.0)
TSH: 1.4 u[IU]/mL (ref 0.450–4.500)

## 2017-11-27 LAB — VITAMIN D 25 HYDROXY (VIT D DEFICIENCY, FRACTURES): VIT D 25 HYDROXY: 69.5 ng/mL (ref 30.0–100.0)

## 2017-11-27 LAB — VITAMIN B12: VITAMIN B 12: 513 pg/mL (ref 232–1245)

## 2017-11-27 NOTE — Telephone Encounter (Signed)
Pt came in for appt. 

## 2017-12-25 ENCOUNTER — Ambulatory Visit: Payer: BLUE CROSS/BLUE SHIELD | Admitting: Family Medicine

## 2018-01-01 DIAGNOSIS — H1045 Other chronic allergic conjunctivitis: Secondary | ICD-10-CM | POA: Diagnosis not present

## 2018-01-01 DIAGNOSIS — H43811 Vitreous degeneration, right eye: Secondary | ICD-10-CM | POA: Diagnosis not present

## 2018-01-01 DIAGNOSIS — H25813 Combined forms of age-related cataract, bilateral: Secondary | ICD-10-CM | POA: Diagnosis not present

## 2018-01-01 DIAGNOSIS — H527 Unspecified disorder of refraction: Secondary | ICD-10-CM | POA: Diagnosis not present

## 2018-01-07 ENCOUNTER — Encounter: Payer: Self-pay | Admitting: Family Medicine

## 2018-01-07 ENCOUNTER — Ambulatory Visit: Payer: BLUE CROSS/BLUE SHIELD | Admitting: Family Medicine

## 2018-01-07 VITALS — BP 116/71 | HR 79 | Temp 98.8°F | Ht 60.0 in | Wt 189.0 lb

## 2018-01-07 DIAGNOSIS — J309 Allergic rhinitis, unspecified: Secondary | ICD-10-CM | POA: Diagnosis not present

## 2018-01-07 MED ORDER — PREDNISONE 20 MG PO TABS: ORAL_TABLET | ORAL | 0 refills | Status: DC

## 2018-01-07 NOTE — Progress Notes (Signed)
BP 116/71   Pulse 79   Temp 98.8 F (37.1 C) (Oral)   Ht 5' (1.524 m)   Wt 189 lb (85.7 kg)   BMI 36.91 kg/m    Subjective:    Patient ID: Rachel Larson, female    DOB: September 12, 1961, 57 y.o.   MRN: [id number]  HPI: Rachel Larson is a 57 y.o. female presenting on 01/07/2018 for Sinusitis (congestion, sore throat, sinus drainage, hoarseness; began about a week ago; has taken Dayquil and Tylenol Severe Sinus, using Flonase daily)   HPI Sore throat congestion and sinus drainage and hoarseness Patient comes in complaining of sore throat and sinus congestion and hoarseness and drainage that began about a week ago.  She is been taken Tylenol and DayQuil and Flonase daily and feels like they are not helping significantly.  She feels like it is getting worse and getting down into her chest now.  She denies any fevers or chills or shortness of breath or wheezing to this point but she is concerned that it will turn into an asthma exacerbation like it has obviously.  She has not restarted taking her Zyrtec or has not been using a humidifier but she has been using hot compresses on her sinuses almost daily.  She feels like it is mild to moderate right now but is concerned that she will get worse and be more severe later like she was in the fall.  Relevant past medical, surgical, family and social history reviewed and updated as indicated. Interim medical history since our last visit reviewed. Allergies and medications reviewed and updated.  Review of Systems  Constitutional: Negative for chills and fever.  HENT: Positive for congestion, postnasal drip, rhinorrhea, sinus pressure, sneezing and sore throat. Negative for ear discharge and ear pain.   Eyes: Negative for pain, redness and visual disturbance.  Respiratory: Positive for cough. Negative for chest tightness, shortness of breath and wheezing.   Cardiovascular: Negative for chest pain and leg swelling.  Genitourinary: Negative for difficulty  urinating and dysuria.  Musculoskeletal: Negative for back pain and gait problem.  Skin: Negative for rash.  Neurological: Negative for light-headedness and headaches.  Psychiatric/Behavioral: Negative for agitation and behavioral problems.  All other systems reviewed and are negative.   Per HPI unless specifically indicated above   Allergies as of 01/07/2018      Reactions   Sulfonamide Derivatives Hives, Itching      Medication List        Accurate as of 01/07/18  4:37 PM. Always use your most recent med list.          albuterol 108 (90 Base) MCG/ACT inhaler Commonly known as:  PROVENTIL HFA;VENTOLIN HFA Inhale 2 puffs every 6 (six) hours as needed into the lungs for wheezing or shortness of breath.   BIOTIN PO Take 1 capsule by mouth daily.   fluticasone 50 MCG/ACT nasal spray Commonly known as:  FLONASE Place 2 sprays into both nostrils daily.   predniSONE 20 MG tablet Commonly known as:  DELTASONE 2 po at same time daily for 5 days   ranitidine 150 MG tablet Commonly known as:  ZANTAC Take 150 mg by mouth 2 (two) times daily.   rosuvastatin 10 MG tablet Commonly known as:  CRESTOR Take 1 tablet (10 mg total) by mouth daily.   VITAMIN D (CHOLECALCIFEROL) PO Take 1 tablet by mouth 2 (two) times a week.          Objective:    BP 116/71  Pulse 79   Temp 98.8 F (37.1 C) (Oral)   Ht 5' (1.524 m)   Wt 189 lb (85.7 kg)   BMI 36.91 kg/m   Wt Readings from Last 3 Encounters:  01/07/18 189 lb (85.7 kg)  11/26/17 188 lb (85.3 kg)  11/05/17 186 lb (84.4 kg)    Physical Exam  Constitutional: She is oriented to person, place, and time. She appears well-developed and well-nourished. No distress.  HENT:  Right Ear: Tympanic membrane, external ear and ear canal normal.  Left Ear: Tympanic membrane, external ear and ear canal normal.  Nose: Mucosal edema and rhinorrhea present. No epistaxis. Right sinus exhibits maxillary sinus tenderness and frontal sinus  tenderness. Left sinus exhibits maxillary sinus tenderness and frontal sinus tenderness.  Mouth/Throat: Uvula is midline and mucous membranes are normal. Posterior oropharyngeal edema present. No oropharyngeal exudate, posterior oropharyngeal erythema or tonsillar abscesses.  Eyes: Conjunctivae and EOM are normal.  Neck: Neck supple. No thyromegaly present.  Cardiovascular: Normal rate, regular rhythm, normal heart sounds and intact distal pulses.  No murmur heard. Pulmonary/Chest: Effort normal and breath sounds normal. No respiratory distress. She has no wheezes. She has no rales.  Musculoskeletal: Normal range of motion.  Lymphadenopathy:    She has no cervical adenopathy.  Neurological: She is alert and oriented to person, place, and time. Coordination normal.  Skin: Skin is warm and dry. No rash noted. She is not diaphoretic.  Psychiatric: She has a normal mood and affect. Her behavior is normal.  Vitals reviewed.       Assessment & Plan:   Problem List Items Addressed This Visit    None    Visit Diagnoses    Allergic sinusitis    -  Primary   Relevant Medications   predniSONE (DELTASONE) 20 MG tablet       Follow up plan: Return if symptoms worsen or fail to improve.  Counseling provided for all of the vaccine components No orders of the defined types were placed in this encounter.   Caryl Pina, MD [facility] 01/07/2018, 4:37 PM

## 2018-01-08 ENCOUNTER — Telehealth: Payer: Self-pay | Admitting: Family Medicine

## 2018-01-08 MED ORDER — AZITHROMYCIN 250 MG PO TABS: ORAL_TABLET | ORAL | 0 refills | Status: DC

## 2018-01-08 NOTE — Telephone Encounter (Signed)
Pt notified of recommendation Verbalizes understanding 

## 2018-01-08 NOTE — Telephone Encounter (Signed)
I sent in the azithromycin but I still recommend for her to try waiting another day or so before taking it and let the prednisone get into her system more. Caryl Pina, MD [facility] 01/08/2018, 1:07 PM

## 2018-04-15 DIAGNOSIS — R87610 Atypical squamous cells of undetermined significance on cytologic smear of cervix (ASC-US): Secondary | ICD-10-CM | POA: Diagnosis not present

## 2018-05-27 ENCOUNTER — Encounter: Payer: Self-pay | Admitting: Family Medicine

## 2018-05-27 ENCOUNTER — Ambulatory Visit: Payer: BLUE CROSS/BLUE SHIELD | Admitting: Family Medicine

## 2018-05-27 VITALS — BP 126/68 | HR 77 | Temp 97.6°F | Ht 60.0 in | Wt 186.0 lb

## 2018-05-27 DIAGNOSIS — E559 Vitamin D deficiency, unspecified: Secondary | ICD-10-CM

## 2018-05-27 DIAGNOSIS — E8881 Metabolic syndrome: Secondary | ICD-10-CM

## 2018-05-27 DIAGNOSIS — E7800 Pure hypercholesterolemia, unspecified: Secondary | ICD-10-CM

## 2018-05-27 DIAGNOSIS — K219 Gastro-esophageal reflux disease without esophagitis: Secondary | ICD-10-CM | POA: Diagnosis not present

## 2018-05-27 DIAGNOSIS — R0609 Other forms of dyspnea: Secondary | ICD-10-CM

## 2018-05-27 DIAGNOSIS — R102 Pelvic and perineal pain: Secondary | ICD-10-CM | POA: Diagnosis not present

## 2018-05-27 DIAGNOSIS — Z1283 Encounter for screening for malignant neoplasm of skin: Secondary | ICD-10-CM

## 2018-05-27 DIAGNOSIS — H9319 Tinnitus, unspecified ear: Secondary | ICD-10-CM

## 2018-05-27 DIAGNOSIS — R0600 Dyspnea, unspecified: Secondary | ICD-10-CM

## 2018-05-27 DIAGNOSIS — W57XXXA Bitten or stung by nonvenomous insect and other nonvenomous arthropods, initial encounter: Secondary | ICD-10-CM

## 2018-05-27 LAB — MICROSCOPIC EXAMINATION
Epithelial Cells (non renal): >10 /hpf — AB (ref 0–10)
Renal Epithel, UA: NONE SEEN /hpf

## 2018-05-27 LAB — URINALYSIS, COMPLETE
Bilirubin, UA: NEGATIVE
GLUCOSE, UA: NEGATIVE
KETONES UA: NEGATIVE
NITRITE UA: NEGATIVE
Protein, UA: NEGATIVE
RBC, UA: NEGATIVE
SPEC GRAV UA: 1.025 (ref 1.005–1.030)
UUROB: 0.2 mg/dL (ref 0.2–1.0)
pH, UA: 5 (ref 5.0–7.5)

## 2018-05-27 MED ORDER — DOXYCYCLINE HYCLATE 100 MG PO TABS: 100.0000 mg | ORAL_TABLET | Freq: Two times a day (BID) | ORAL | 0 refills | Status: DC

## 2018-05-27 MED ORDER — ROSUVASTATIN CALCIUM 10 MG PO TABS: 10.0000 mg | ORAL_TABLET | Freq: Every day | ORAL | 3 refills | Status: DC
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
## 2018-05-27 NOTE — Patient Instructions (Addendum)
Continue current medications. Continue good therapeutic lifestyle changes which include good diet and exercise. Fall precautions discussed with patient. If an FOBT was given today- please return it to our front desk. If you are over 56 years old - you may need Prevnar 24 or the adult Pneumonia vaccine.  **Flu shots are available--- please call and schedule a FLU-CLINIC appointment**  After your visit with [place] today you will receive a survey in the mail or online from [employer] regarding your care with [place]. Please take a moment to fill this out. Your feedback is very important to [place] as you can help [place] better understand your patient needs as well as improve your experience and satisfaction. WE CARE ABOUT YOU!!!   We will schedule a visit with the pulmonologist to further evaluate this ongoing problem with shortness of breath. In the meantime try some over-the-counter omeprazole 1 daily before breakfast instead of taking the ranitidine 150 mg and let the pulmonologist know if this is helped the breathing and tight feeling in the neck. We will also schedule visit with a dermatologist to look at the lesion on your left cheek.  And the skin tags of the neck. We will call with lab work results as soon as these results become available We will also call with the urine results as soon as those become available along with the tick bite test results.  I believe we will hold any treatment until those results are returned since the tick that was removed was not a deer tick and it was on her for may be no more than 24 hours. For prophylaxis, go ahead and take doxycycline 100 mg twice daily with food for 24 hours and then hold the remainder of the prescription until the test results are returned. Follow-up with ear nose and throat specialist for tinnitus as planned

## 2018-05-29 LAB — CBC WITH DIFFERENTIAL/PLATELET
BASOS ABS: 0 10*3/uL (ref 0.0–0.2)
Basos: 0 %
EOS (ABSOLUTE): 0.2 10*3/uL (ref 0.0–0.4)
EOS: 3 %
HEMATOCRIT: 37.3 % (ref 34.0–46.6)
HEMOGLOBIN: 12.7 g/dL (ref 11.1–15.9)
IMMATURE GRANULOCYTES: 0 %
Immature Grans (Abs): 0 10*3/uL (ref 0.0–0.1)
LYMPHS: 45 %
Lymphocytes Absolute: 2.4 10*3/uL (ref 0.7–3.1)
MCH: 31.8 pg (ref 26.6–33.0)
MCHC: 34 g/dL (ref 31.5–35.7)
MCV: 94 fL (ref 79–97)
MONOCYTES: 8 %
Monocytes Absolute: 0.4 10*3/uL (ref 0.1–0.9)
NEUTROS PCT: 44 %
Neutrophils Absolute: 2.4 10*3/uL (ref 1.4–7.0)
Platelets: 192 10*3/uL (ref 150–450)
RBC: 3.99 x10E6/uL (ref 3.77–5.28)
RDW: 14 % (ref 12.3–15.4)
WBC: 5.4 10*3/uL (ref 3.4–10.8)

## 2018-05-29 LAB — BMP8+EGFR
BUN/Creatinine Ratio: 27 — ABNORMAL HIGH (ref 9–23)
BUN: 19 mg/dL (ref 6–24)
CALCIUM: 9.3 mg/dL (ref 8.7–10.2)
CO2: 22 mmol/L (ref 20–29)
CREATININE: 0.7 mg/dL (ref 0.57–1.00)
Chloride: 103 mmol/L (ref 96–106)
GFR calc Af Amer: 113 mL/min/{1.73_m2} (ref >59)
GFR, EST NON AFRICAN AMERICAN: 98 mL/min/{1.73_m2} (ref >59)
GLUCOSE: 87 mg/dL (ref 65–99)
POTASSIUM: 3.8 mmol/L (ref 3.5–5.2)
Sodium: 140 mmol/L (ref 134–144)

## 2018-05-29 LAB — LYME AB/WESTERN BLOT REFLEX

## 2018-05-29 LAB — LIPID PANEL
CHOL/HDL RATIO: 2.8 ratio (ref 0.0–4.4)
CHOLESTEROL TOTAL: 150 mg/dL (ref 100–199)
HDL: 54 mg/dL (ref >39)
LDL Calculated: 71 mg/dL (ref 0–99)
TRIGLYCERIDES: 126 mg/dL (ref 0–149)
VLDL Cholesterol Cal: 25 mg/dL (ref 5–40)

## 2018-05-29 LAB — HEPATIC FUNCTION PANEL
ALBUMIN: 4.7 g/dL (ref 3.5–5.5)
ALK PHOS: 86 IU/L (ref 39–117)
ALT: 16 IU/L (ref 0–32)
AST: 14 IU/L (ref 0–40)
BILIRUBIN TOTAL: 0.4 mg/dL (ref 0.0–1.2)
Bilirubin, Direct: 0.13 mg/dL (ref 0.00–0.40)
Total Protein: 7.3 g/dL (ref 6.0–8.5)

## 2018-05-29 LAB — URINE CULTURE

## 2018-05-29 LAB — VITAMIN D 25 HYDROXY (VIT D DEFICIENCY, FRACTURES): Vit D, 25-Hydroxy: 64.9 ng/mL (ref 30.0–100.0)

## 2018-05-29 LAB — ROCKY MTN SPOTTED FVR ABS PNL(IGG+IGM)
RMSF IgG: NEGATIVE
RMSF IgM: 0.34 index (ref 0.00–0.89)

## 2018-06-11 DIAGNOSIS — B078 Other viral warts: Secondary | ICD-10-CM | POA: Diagnosis not present

## 2018-06-11 DIAGNOSIS — L918 Other hypertrophic disorders of the skin: Secondary | ICD-10-CM | POA: Diagnosis not present

## 2018-06-11 DIAGNOSIS — D225 Melanocytic nevi of trunk: Secondary | ICD-10-CM | POA: Diagnosis not present

## 2018-06-11 DIAGNOSIS — L821 Other seborrheic keratosis: Secondary | ICD-10-CM | POA: Diagnosis not present

## 2018-06-15 ENCOUNTER — Encounter: Payer: Self-pay | Admitting: Family

## 2018-06-15 ENCOUNTER — Ambulatory Visit: Payer: BLUE CROSS/BLUE SHIELD | Admitting: Family

## 2018-06-15 VITALS — BP 113/63 | HR 90 | Temp 100.1°F | Ht 60.0 in | Wt 188.4 lb

## 2018-06-15 DIAGNOSIS — J029 Acute pharyngitis, unspecified: Secondary | ICD-10-CM | POA: Diagnosis not present

## 2018-06-15 LAB — RAPID STREP SCREEN (MED CTR MEBANE ONLY): Strep Gp A Ag, IA W/Reflex: NEGATIVE

## 2018-06-15 LAB — CULTURE, GROUP A STREP

## 2018-06-15 MED ORDER — AMOXICILLIN 500 MG PO CAPS
500.0000 mg | ORAL_CAPSULE | Freq: Two times a day (BID) | ORAL | 0 refills | Status: DC
Start: 2018-06-15 — End: 2018-06-24

## 2018-06-15 NOTE — Progress Notes (Signed)
   Subjective:    Patient ID: Rachel Larson, female    DOB: 1961-09-06, 57 y.o.   MRN: [id number]  Chief Complaint  Patient presents with  . Sore Throat    Sore Throat   This is a new problem. The current episode started in the past 7 days. The problem has been gradually worsening. The pain is worse on the left side. The maximum temperature recorded prior to her arrival was 100.4 - 100.9 F. The pain is at a severity of 7/10. The pain is moderate. Associated symptoms include coughing, headaches, a hoarse voice, swollen glands and trouble swallowing. Pertinent negatives include no congestion or ear pain. She has tried NSAIDs (started taking doxycyline 06/13/18) for the symptoms. The treatment provided mild relief.      Review of Systems  HENT: Positive for hoarse voice and trouble swallowing. Negative for congestion and ear pain.   Respiratory: Positive for cough.   Neurological: Positive for headaches.  All other systems reviewed and are negative.      Objective:   Physical Exam  Constitutional: She is oriented to person, place, and time. She appears well-developed and well-nourished. No distress.  HENT:  Head: Normocephalic and atraumatic.  Right Ear: External ear normal.  Mouth/Throat: Oropharyngeal exudate, posterior oropharyngeal edema and posterior oropharyngeal erythema present. Tonsils are 2+ on the right. Tonsils are 2+ on the left.  Eyes: Pupils are equal, round, and reactive to light.  Neck: Normal range of motion. Neck supple. No thyromegaly present.  Cardiovascular: Normal rate, regular rhythm, normal heart sounds and intact distal pulses.  No murmur heard. Pulmonary/Chest: Effort normal and breath sounds normal. No respiratory distress. She has no wheezes.  Abdominal: Soft. Bowel sounds are normal. She exhibits no distension. There is no tenderness.  Musculoskeletal: Normal range of motion. She exhibits no edema or tenderness.  Neurological: She is alert and oriented  to person, place, and time. She has normal reflexes. No cranial nerve deficit.  Skin: Skin is warm and dry.  Psychiatric: She has a normal mood and affect. Her behavior is normal. Judgment and thought content normal.  Vitals reviewed.     Blood pressure 113/63, pulse 90, temperature 100.1 F (37.8 C), temperature source Oral, height 5' (1.524 m), weight 188 lb 6.4 oz (85.5 kg).      Assessment & Plan:  Rachel Larson comes in today with chief complaint of Sore Throat   Diagnosis and orders addressed:  1. Sore throat - Rapid Strep Screen ([facility] ONLY)  2. Acute pharyngitis, unspecified etiology - Take meds as prescribed - Use a cool mist humidifier  -Use saline nose sprays frequently -Force fluids -For any cough or congestion  Use plain Mucinex- regular strength or max strength is fine -For fever or aces or pains- take tylenol or ibuprofen. -Throat lozenges if help -New toothbrush in 3 days - amoxicillin (AMOXIL) 500 MG capsule; Take 1 capsule (500 mg total) by mouth 2 (two) times daily.  Dispense: 14 capsule; Refill: 0   Evelina Dun, FNP

## 2018-06-15 NOTE — Patient Instructions (Signed)

## 2018-06-24 ENCOUNTER — Ambulatory Visit: Payer: BLUE CROSS/BLUE SHIELD | Admitting: Pulmonary Disease

## 2018-06-24 ENCOUNTER — Ambulatory Visit (INDEPENDENT_AMBULATORY_CARE_PROVIDER_SITE_OTHER)
Admission: RE | Admit: 2018-06-24 | Discharge: 2018-06-24 | Disposition: A | Discharge status: Home / Self Care | Payer: BLUE CROSS/BLUE SHIELD | Source: Ambulatory Visit | Attending: Pulmonary Disease | Admitting: Pulmonary Disease

## 2018-06-24 ENCOUNTER — Encounter: Payer: Self-pay | Admitting: Pulmonary Disease

## 2018-06-24 VITALS — BP 116/62 | HR 73 | Ht 61.0 in | Wt 189.0 lb

## 2018-06-24 DIAGNOSIS — R0602 Shortness of breath: Secondary | ICD-10-CM | POA: Diagnosis not present

## 2018-06-24 DIAGNOSIS — R05 Cough: Secondary | ICD-10-CM | POA: Diagnosis not present

## 2018-06-24 DIAGNOSIS — R0683 Snoring: Secondary | ICD-10-CM | POA: Diagnosis not present

## 2018-06-24 NOTE — Patient Instructions (Signed)
High probability of sleep disordered breathing  Shortness of breath  Pulmonary function study Obtain a chest x-ray Obtain a home sleep study  I will see you back in the office in about 6 to 8 weeks following initiation of treatment if needed  Encouraged regular exercises

## 2018-06-24 NOTE — Progress Notes (Signed)
Rachel Larson    [id number]    [birth year]  Primary Care Physician:Moore, Estella Husk, MD  Referring Physician: Chipper Herb, [address] [place], [place] [address]  Chief complaint:   Shortness of breath  HPI: History of pharyngitis Has been on 2 recent antibiotics Shortness of breath with activity She can walk on level ground but gets short of breath on elevation  Quit smoking many years back in 1999 Denies any chest pains or chest discomfort She does have an occasional cough History of reflux Sore throats History of significant snoring Nonrestorative sleep Epworth Sleepiness Scale of 12  Occupation: No pertinent predisposition Smoking history: Reformed smoker  Outpatient Encounter Medications as of 06/24/2018  Medication Sig  . albuterol (PROVENTIL HFA;VENTOLIN HFA) 108 (90 Base) MCG/ACT inhaler Inhale 2 puffs every 6 (six) hours as needed into the lungs for wheezing or shortness of breath.  Marland Kitchen BIOTIN PO Take 1 capsule by mouth daily.  . fluticasone (FLONASE) 50 MCG/ACT nasal spray Place 2 sprays into both nostrils daily.  . ranitidine (ZANTAC) 150 MG tablet Take 150 mg by mouth 2 (two) times daily.  . rosuvastatin (CRESTOR) 10 MG tablet Take 1 tablet (10 mg total) by mouth daily.  Marland Kitchen VITAMIN D, CHOLECALCIFEROL, PO Take 1 tablet by mouth 2 (two) times a week.   . [DISCONTINUED] amoxicillin (AMOXIL) 500 MG capsule Take 1 capsule (500 mg total) by mouth 2 (two) times daily.  . [DISCONTINUED] doxycycline (VIBRA-TABS) 100 MG tablet Take 1 tablet (100 mg total) by mouth 2 (two) times daily. 1 po bid   No facility-administered encounter medications on file as of 06/24/2018.     Allergies as of 06/24/2018 - Review Complete 06/24/2018  Allergen Reaction Noted  . Sulfonamide derivatives Hives and Itching 06/26/2011    Past Medical History:  Diagnosis Date  . Baker cyst    rt knee  . Chest pain   . Hyperlipidemia   . Reflux     Past Surgical History:    Procedure Laterality Date  . CHOLECYSTECTOMY    . NASAL SINUS SURGERY      Family History  Problem Relation Age of Onset  . Diabetes Mother   . Hypertension Brother   . Diabetes type II Brother   . Heart failure Maternal Grandmother        Died age 67  . Hypertension Brother   . Diabetes type II Brother   . Colon cancer Neg Hx     Social History   Socioeconomic History  . Marital status: Married    Spouse name: Not on file  . Number of children: 2  . Years of education: Not on file  . Highest education level: Not on file  Occupational History  . Occupation: Press photographer     Comment: Copy  Social Needs  . Financial resource strain: Not on file  . Food insecurity:    Worry: Not on file    Inability: Not on file  . Transportation needs:    Medical: Not on file    Non-medical: Not on file  Tobacco Use  . Smoking status: Former Smoker    Packs/day: 1.00    Years: 18.00    Pack years: 18.00    Types: Cigarettes    Start date: 10/24/1977    Last attempt to quit: 07/09/1998    Years since quitting: 19.9  . Smokeless tobacco: Never Used  Substance and Sexual Activity  .  Alcohol use: No    Frequency: Never    Comment: occasionally  . Drug use: No  . Sexual activity: Not on file  Lifestyle  . Physical activity:    Days per week: Not on file    Minutes per session: Not on file  . Stress: Not on file  Relationships  . Social connections:    Talks on phone: Not on file    Gets together: Not on file    Attends religious service: Not on file    Active member of club or organization: Not on file    Attends meetings of clubs or organizations: Not on file    Relationship status: Not on file  . Intimate partner violence:    Fear of current or ex partner: Not on file    Emotionally abused: Not on file    Physically abused: Not on file    Forced sexual activity: Not on file  Other Topics Concern  . Not on file  Social History Narrative   Lives with  husband    Review of Systems  Constitutional: Negative.   HENT: Negative.   Eyes: Negative.   Respiratory: Positive for cough and shortness of breath.   Cardiovascular: Negative.  Negative for chest pain and palpitations.  Gastrointestinal: Negative.   Endocrine: Negative.   Genitourinary: Negative.   Musculoskeletal: Negative.     Vitals:   06/24/18 1609  BP: 116/62  Pulse: 73  SpO2: 97%     Physical Exam  Constitutional: She is oriented to person, place, and time. She appears well-developed and well-nourished.  HENT:  Head: Normocephalic and atraumatic.  Mallampati 3  Eyes: Pupils are equal, round, and reactive to light. Conjunctivae and EOM are normal. Right eye exhibits no discharge. Left eye exhibits no discharge.  Neck: Normal range of motion. Neck supple. No tracheal deviation present. No thyromegaly present.  Cardiovascular: Normal rate and regular rhythm.  Pulmonary/Chest: Effort normal and breath sounds normal. No respiratory distress. She has no wheezes. She has no rales.  Abdominal: Soft. Bowel sounds are normal. She exhibits no distension. There is no tenderness.  Musculoskeletal: Normal range of motion. She exhibits no edema or deformity.  Neurological: She is alert and oriented to person, place, and time. She has normal reflexes. No cranial nerve deficit.  Skin: Skin is warm and dry. No erythema.  Psychiatric: She has a normal mood and affect.     Data Reviewed: Most recent lab data from 05/27/2018 reviewed, normal Chem-7, normal CBC  Assessment:   High probability of significant sleep disordered breathing  Coronary artery disease  Shortness of breath  Possible obstructive lung disease    Plan/Recommendations:  Pathophysiology of sleep disordered breathing discussed with the patient  Options of treatment discussed with the patient  We will obtain a chest x-ray  Obtain a pulmonary function study  Regular exercise is encouraged  Encouraged  to call if any significant changes in symptoms   [name] MD [facility] 06/24/2018, 4:19 PM  CC: Chipper Herb, MD

## 2018-06-25 DIAGNOSIS — R0602 Shortness of breath: Secondary | ICD-10-CM | POA: Diagnosis not present

## 2018-06-25 DIAGNOSIS — H9313 Tinnitus, bilateral: Secondary | ICD-10-CM | POA: Diagnosis not present

## 2018-06-25 DIAGNOSIS — R0683 Snoring: Secondary | ICD-10-CM | POA: Diagnosis not present

## 2018-06-25 DIAGNOSIS — K219 Gastro-esophageal reflux disease without esophagitis: Secondary | ICD-10-CM | POA: Diagnosis not present

## 2018-06-25 DIAGNOSIS — E669 Obesity, unspecified: Secondary | ICD-10-CM | POA: Diagnosis not present

## 2018-06-26 ENCOUNTER — Telehealth: Payer: Self-pay | Admitting: Pulmonary Disease

## 2018-06-26 NOTE — Telephone Encounter (Signed)
Notes recorded by Laurin Coder, MD on 06/26/2018 at 11:06 AM EDT Call patient  Normal x-ray  Routine follow-up -------------------------------------- Spoke with pt. She is aware of results. Nothing further was needed.

## 2018-07-06 ENCOUNTER — Ambulatory Visit (INDEPENDENT_AMBULATORY_CARE_PROVIDER_SITE_OTHER): Payer: BLUE CROSS/BLUE SHIELD | Admitting: Pulmonary Disease

## 2018-07-06 DIAGNOSIS — G4733 Obstructive sleep apnea (adult) (pediatric): Secondary | ICD-10-CM | POA: Diagnosis not present

## 2018-07-06 DIAGNOSIS — R0602 Shortness of breath: Secondary | ICD-10-CM | POA: Diagnosis not present

## 2018-07-06 LAB — PULMONARY FUNCTION TEST
DL/VA % pred: 102 %
DL/VA: 4.51 ml/min/mmHg/L
DLCO COR % PRED: 86 %
DLCO COR: 17.47 ml/min/mmHg
DLCO UNC % PRED: 84 %
DLCO UNC: 17.08 ml/min/mmHg
FEF 25-75 POST: 3.46 L/s
FEF 25-75 PRE: 3.33 L/s
FEF2575-%Change-Post: 3 %
FEF2575-%PRED-PRE: 141 %
FEF2575-%Pred-Post: 146 %
FEV1-%Change-Post: 3 %
FEV1-%PRED-POST: 97 %
FEV1-%Pred-Pre: 94 %
FEV1-POST: 2.34 L
FEV1-Pre: 2.25 L
FEV1FVC-%CHANGE-POST: 2 %
FEV1FVC-%Pred-Pre: 114 %
FEV6-%CHANGE-POST: 1 %
FEV6-%PRED-POST: 85 %
FEV6-%PRED-PRE: 84 %
FEV6-POST: 2.53 L
FEV6-Pre: 2.5 L
FEV6FVC-%PRED-POST: 103 %
FEV6FVC-%Pred-Pre: 103 %
FVC-%CHANGE-POST: 1 %
FVC-%Pred-Post: 82 %
FVC-%Pred-Pre: 81 %
FVC-Post: 2.53 L
FVC-Pre: 2.5 L
POST FEV6/FVC RATIO: 100 %
PRE FEV1/FVC RATIO: 90 %
Post FEV1/FVC ratio: 92 %
Pre FEV6/FVC Ratio: 100 %
RV % pred: 94 %
RV: 1.65 L
TLC % PRED: 95 %
TLC: 4.39 L

## 2018-07-06 NOTE — Progress Notes (Signed)
PFT done today. 

## 2018-07-07 ENCOUNTER — Other Ambulatory Visit: Payer: Self-pay | Admitting: *Deleted

## 2018-07-07 DIAGNOSIS — R0683 Snoring: Secondary | ICD-10-CM

## 2018-07-08 ENCOUNTER — Telehealth: Payer: Self-pay

## 2018-07-08 DIAGNOSIS — G4733 Obstructive sleep apnea (adult) (pediatric): Secondary | ICD-10-CM | POA: Diagnosis not present

## 2018-07-08 DIAGNOSIS — R0683 Snoring: Secondary | ICD-10-CM

## 2018-07-08 NOTE — Telephone Encounter (Signed)
Dr. Ander Slade has reviewed the home sleep test this showed Mild obstructive sleep apnea. With moderate hypoxemia noted with bradycardic episodes noted.   Recommendations : In lab cpap titration   Weight loss measures .   Advise against driving while sleepy & against medication with sedative side effects.    Make appointment for 8 to 10 weeks for compliance with download with Dr. Ander Slade.

## 2018-07-08 NOTE — Telephone Encounter (Signed)
Left message for patient to call back regarding her sleep study results and recommendations.

## 2018-07-09 NOTE — Telephone Encounter (Signed)
Pt is calling back [phone]

## 2018-07-09 NOTE — Telephone Encounter (Signed)
Spoke with patient, made aware of results per AO. Order placed for cpap titration study. No questions at this time. Voiced understanding. Nothing further needed at this time.

## 2018-07-27 ENCOUNTER — Telehealth: Payer: Self-pay | Admitting: Pulmonary Disease

## 2018-07-27 NOTE — Telephone Encounter (Signed)
Patient returned Brittany's call she states that has been within the hour, CB [phone].

## 2018-07-27 NOTE — Telephone Encounter (Signed)
LMTCB. Patient is requesting results of PFT.   AO please advise.

## 2018-07-27 NOTE — Telephone Encounter (Signed)
Called and spoke with patient.  Patient was wanting the results of her PFT.  Results have not been read by Dr. Ander Slade at this time.  Explained that someone will call her as soon as results are given.  Patient stated understanding.  Nothing further at this time.

## 2018-09-09 ENCOUNTER — Ambulatory Visit: Payer: BLUE CROSS/BLUE SHIELD | Admitting: Family Medicine

## 2018-09-09 ENCOUNTER — Encounter: Payer: Self-pay | Admitting: Family Medicine

## 2018-09-09 VITALS — BP 117/69 | HR 91 | Temp 100.2°F | Ht 61.0 in | Wt 185.2 lb

## 2018-09-09 DIAGNOSIS — J101 Influenza due to other identified influenza virus with other respiratory manifestations: Secondary | ICD-10-CM

## 2018-09-09 LAB — CULTURE, GROUP A STREP

## 2018-09-09 LAB — VERITOR FLU A/B WAIVED
Influenza A: POSITIVE — AB
Influenza B: NEGATIVE

## 2018-09-09 LAB — RAPID STREP SCREEN (MED CTR MEBANE ONLY): Strep Gp A Ag, IA W/Reflex: NEGATIVE

## 2018-09-09 MED ORDER — OSELTAMIVIR PHOSPHATE 75 MG PO CAPS: 75.0000 mg | ORAL_CAPSULE | Freq: Two times a day (BID) | ORAL | 0 refills | Status: DC

## 2018-09-09 MED ORDER — FLUTICASONE PROPIONATE 50 MCG/ACT NA SUSP: 2.0000 | Freq: Every day | NASAL | 6 refills | Status: DC

## 2018-09-09 MED ORDER — ALBUTEROL SULFATE HFA 108 (90 BASE) MCG/ACT IN AERS: 2.0000 | INHALATION_SPRAY | Freq: Four times a day (QID) | RESPIRATORY_TRACT | 5 refills | Status: DC | PRN

## 2018-09-09 NOTE — Progress Notes (Signed)
BP 117/69   Pulse 91   Temp 100.2 F (37.9 C) (Oral)   Ht 5\' 1"  (1.549 m)   Wt 185 lb 3.2 oz (84 kg)   BMI 34.99 kg/m    Subjective:    Patient ID: Rachel Larson, female    DOB: [birth year], 57 y.o.   MRN: [id number]  HPI: Rachel Larson is a 57 y.o. female presenting on 09/09/2018 for Chills; Headache; Cough; Fever; and Sore Throat   HPI Cough and headache and fever and myalgia Patient comes in complaining of cough and congestion fever and myalgias that is been going on over the past 2 days.  She says her husband was ill prior and had a very similar illness but he refused to go to the doctor.  She says her cough and muscle aches been getting worse and she feels like she is is not doing very well with it.  She denies any shortness of breath but she has had some wheezing and she is also had fevers as high as 101.2 at home and 100 and 0.2 here.  She does use her albuterol sometimes when she starts to feel tight and wheezy in her chest.  Relevant past medical, surgical, family and social history reviewed and updated as indicated. Interim medical history since our last visit reviewed. Allergies and medications reviewed and updated.  Review of Systems  Constitutional: Positive for chills and fever.  HENT: Positive for postnasal drip, rhinorrhea, sinus pressure, sneezing and sore throat. Negative for congestion, ear discharge and ear pain.   Eyes: Negative for pain, redness and visual disturbance.  Respiratory: Negative for chest tightness and shortness of breath.   Cardiovascular: Negative for chest pain and leg swelling.  Genitourinary: Negative for difficulty urinating and dysuria.  Musculoskeletal: Positive for myalgias. Negative for back pain and gait problem.  Skin: Negative for rash.  Neurological: Negative for light-headedness and headaches.  Psychiatric/Behavioral: Negative for agitation and behavioral problems.  All other systems reviewed and are negative.   Per HPI unless  specifically indicated above   Allergies as of 09/09/2018      Reactions   Sulfonamide Derivatives Hives, Itching      Medication List        Accurate as of 09/09/18  6:39 PM. Always use your most recent med list.          albuterol 108 (90 Base) MCG/ACT inhaler Commonly known as:  PROVENTIL HFA;VENTOLIN HFA Inhale 2 puffs into the lungs every 6 (six) hours as needed for wheezing or shortness of breath.   BIOTIN PO Take 1 capsule by mouth daily.   fluticasone 50 MCG/ACT nasal spray Commonly known as:  FLONASE Place 2 sprays into both nostrils daily.   oseltamivir 75 MG capsule Commonly known as:  TAMIFLU Take 1 capsule (75 mg total) by mouth 2 (two) times daily.   rosuvastatin 10 MG tablet Commonly known as:  CRESTOR Take 1 tablet (10 mg total) by mouth daily.   VITAMIN D (CHOLECALCIFEROL) PO Take 1 tablet by mouth 2 (two) times a week.          Objective:    BP 117/69   Pulse 91   Temp 100.2 F (37.9 C) (Oral)   Ht 5\' 1"  (1.549 m)   Wt 185 lb 3.2 oz (84 kg)   BMI 34.99 kg/m   Wt Readings from Last 3 Encounters:  09/09/18 185 lb 3.2 oz (84 kg)  06/24/18 189 lb (85.7 kg)  06/15/18 188 lb 6.4  oz (85.5 kg)    Physical Exam  Constitutional: She is oriented to person, place, and time. She appears well-developed and well-nourished. No distress.  HENT:  Right Ear: Tympanic membrane, external ear and ear canal normal.  Left Ear: Tympanic membrane, external ear and ear canal normal.  Nose: Mucosal edema and rhinorrhea present. No epistaxis. Right sinus exhibits no maxillary sinus tenderness and no frontal sinus tenderness. Left sinus exhibits no maxillary sinus tenderness and no frontal sinus tenderness.  Mouth/Throat: Uvula is midline and mucous membranes are normal. Posterior oropharyngeal edema and posterior oropharyngeal erythema present. No oropharyngeal exudate or tonsillar abscesses.  Eyes: Conjunctivae and EOM are normal.  Cardiovascular: Normal rate,  regular rhythm, normal heart sounds and intact distal pulses.  No murmur heard. Pulmonary/Chest: Effort normal and breath sounds normal. No respiratory distress. She has no wheezes.  Musculoskeletal: Normal range of motion. She exhibits no edema or tenderness.  Neurological: She is alert and oriented to person, place, and time. Coordination normal.  Skin: Skin is warm and dry. No rash noted. She is not diaphoretic.  Psychiatric: She has a normal mood and affect. Her behavior is normal.  Vitals reviewed.   Results for orders placed or performed in visit on 09/09/18  Rapid Strep Screen ([facility] ONLY)  Result Value Ref Range   Strep Gp A Ag, IA W/Reflex Negative Negative  Culture, Group A Strep  Result Value Ref Range   Strep A Culture CANCELED   Veritor Flu A/B Waived  Result Value Ref Range   Influenza A Positive (A) Negative   Influenza B Negative Negative      Assessment & Plan:   Problem List Items Addressed This Visit    None    Visit Diagnoses    Influenza A    -  Primary   Relevant Medications   albuterol (PROVENTIL HFA;VENTOLIN HFA) 108 (90 Base) MCG/ACT inhaler   fluticasone (FLONASE) 50 MCG/ACT nasal spray   oseltamivir (TAMIFLU) 75 MG capsule   Other Relevant Orders   Rapid Strep Screen ([facility] ONLY) (Completed)   Veritor Flu A/B Waived (Completed)      Patient was influenza A positive, her husband had been sick, will send Tamiflu Follow up plan: Return if symptoms worsen or fail to improve.  Counseling provided for all of the vaccine components Orders Placed This Encounter  Procedures  . Rapid Strep Screen ([facility] ONLY)  . Culture, Group A Strep  . Veritor Flu A/B [place], MD [facility] 09/09/2018, 6:39 PM

## 2018-11-12 DIAGNOSIS — Z01419 Encounter for gynecological examination (general) (routine) without abnormal findings: Secondary | ICD-10-CM | POA: Diagnosis not present

## 2018-11-12 DIAGNOSIS — Z1231 Encounter for screening mammogram for malignant neoplasm of breast: Secondary | ICD-10-CM | POA: Diagnosis not present

## 2018-11-12 DIAGNOSIS — Z6830 Body mass index (BMI) 30.0-30.9, adult: Secondary | ICD-10-CM | POA: Diagnosis not present

## 2018-11-26 ENCOUNTER — Encounter: Payer: Self-pay | Admitting: Family Medicine

## 2018-11-26 ENCOUNTER — Ambulatory Visit: Payer: BLUE CROSS/BLUE SHIELD | Admitting: Family Medicine

## 2018-11-26 VITALS — BP 122/74 | HR 80 | Temp 97.7°F | Ht 61.0 in | Wt 189.0 lb

## 2018-11-26 DIAGNOSIS — R0609 Other forms of dyspnea: Secondary | ICD-10-CM

## 2018-11-26 DIAGNOSIS — Z23 Encounter for immunization: Secondary | ICD-10-CM

## 2018-11-26 DIAGNOSIS — E7800 Pure hypercholesterolemia, unspecified: Secondary | ICD-10-CM | POA: Diagnosis not present

## 2018-11-26 DIAGNOSIS — R0600 Dyspnea, unspecified: Secondary | ICD-10-CM

## 2018-11-26 DIAGNOSIS — E8881 Metabolic syndrome: Secondary | ICD-10-CM | POA: Diagnosis not present

## 2018-11-26 DIAGNOSIS — E559 Vitamin D deficiency, unspecified: Secondary | ICD-10-CM | POA: Diagnosis not present

## 2018-11-26 DIAGNOSIS — K219 Gastro-esophageal reflux disease without esophagitis: Secondary | ICD-10-CM | POA: Diagnosis not present

## 2018-11-26 MED ORDER — ROSUVASTATIN CALCIUM 10 MG PO TABS: 10.0000 mg | ORAL_TABLET | Freq: Every day | ORAL | 3 refills | Status: DC

## 2018-11-26 NOTE — Patient Instructions (Addendum)
Medicare Annual Wellness Visit  [facility] and the medical providers at [facility] strive to bring you the best medical care.  In doing so we not only want to address your current medical conditions and concerns but also to detect new conditions early and prevent illness, disease and health-related problems.    Medicare offers a yearly Wellness Visit which allows our clinical staff to assess your need for preventative services including immunizations, lifestyle education, counseling to decrease risk of preventable diseases and screening for fall risk and other medical concerns.    This visit is provided free of charge (no copay) for all Medicare recipients. The clinical pharmacists at [facility] have begun to conduct these Wellness Visits which will also include a thorough review of all your medications.    As you primary medical provider recommend that you make an appointment for your Annual Wellness Visit if you have not done so already this year.  You may set up this appointment before you leave today or you may call back ([phone]) and schedule an appointment.  Please make sure when you call that you mention that you are scheduling your Annual Wellness Visit with the clinical pharmacist so that the appointment may be made for the proper length of time.     Continue current medications. Continue good therapeutic lifestyle changes which include good diet and exercise. Fall precautions discussed with patient. If an FOBT was given today- please return it to our front desk. If you are over 11 years old - you may need Prevnar 21 or the adult Pneumonia vaccine.  **Flu shots are available--- please call and schedule a FLU-CLINIC appointment**  After your visit with [place] today you will receive a survey in the mail or online from [employer] regarding your care with [place]. Please take a moment to fill this out. Your feedback is very  important to [place] as you can help [place] better understand your patient needs as well as improve your experience and satisfaction. WE CARE ABOUT YOU!!!   Call the pulmonologist again and make arrangements to follow through on the previous studies that he recommended Leave off milk cheese ice cream and dairy products Drink flavored water with no calories and no sodium Make a special effort to eat less bread and carbs You can do this. We will recheck you again in 4 weeks.

## 2018-11-26 NOTE — Progress Notes (Signed)
Subjective:    Patient ID: Rachel Larson, female    DOB: 1961/03/14, 58 y.o.   MRN: [id number]  HPI Pt here for follow up and management of chronic medical problems which includes hyperlipidemia. She is taking medication regularly.  The patient does complain of some shortness of breath.  She has seen cardiologist and pulmonologist.  She is given an FOBT to return and will get lab work today.  Her vital signs are stable and her weight is up about 4 pounds since the previous visit.  He has a history of hyperlipidemia.  Patient is pleasant and a good historian.  She did see the cardiologist and had a stress test a little over a year ago and was told everything was good.  She did see the pulmonologist, Dr. Paulla Fore and had a home sleep test and did have a few abnormalities on this and he wanted some additional testing and unfortunately she did not get that done and she plans to call the pulmonologist back up and resume any further testing that he thinks may be necessary to further evaluate her shortness of breath.  She will make every effort possible to reduce her calorie intake her sodium intake and watch milk cheese ice cream and dairy products to see if she can get rid of bloating in her belly to see if this may help her shortness of breath also.  She promises to make a commitment to do this.  She has had some slight pressure in the chest with shortness of breath.  She denies any trouble with her bowel habits or change in bowel habits other than the bloatedness that she has in her belly which could be contributing to her shortness of breath.  She does like to eat cheese.  She is passing her water without problems.     Patient Active Problem List   Diagnosis Date Noted  . BMI 35.0-35.9,adult 03/12/2016  . Allergic rhinitis due to pollen 03/12/2016  . Osteopenia 03/30/2014  . Metabolic syndrome 01/31/6437  . Hyperlipidemia 01/31/2014  . Allergic rhinitis 07/22/2013  . GERD (gastroesophageal reflux  disease) 07/22/2013  . Chest pain 06/26/2011  . Overweight(278.02) 06/26/2011   Outpatient Encounter Medications as of 11/26/2018  Medication Sig  . albuterol (PROVENTIL HFA;VENTOLIN HFA) 108 (90 Base) MCG/ACT inhaler Inhale 2 puffs into the lungs every 6 (six) hours as needed for wheezing or shortness of breath.  Marland Kitchen BIOTIN PO Take 1 capsule by mouth daily.  Marland Kitchen estradiol (ESTRACE) 0.1 MG/GM vaginal cream   . fluticasone (FLONASE) 50 MCG/ACT nasal spray Place 2 sprays into both nostrils daily.  . rosuvastatin (CRESTOR) 10 MG tablet Take 1 tablet (10 mg total) by mouth daily.  Marland Kitchen VITAMIN D, CHOLECALCIFEROL, PO Take 1 tablet by mouth 2 (two) times a week.   . [DISCONTINUED] oseltamivir (TAMIFLU) 75 MG capsule Take 1 capsule (75 mg total) by mouth 2 (two) times daily.   No facility-administered encounter medications on file as of 11/26/2018.      Review of Systems  Constitutional: Negative.   HENT: Negative.   Eyes: Negative.   Respiratory: Positive for shortness of breath.   Cardiovascular: Negative.   Gastrointestinal: Negative.   Endocrine: Negative.   Genitourinary: Negative.   Musculoskeletal: Negative.   Skin: Negative.   Allergic/Immunologic: Negative.   Neurological: Negative.   Hematological: Negative.   Psychiatric/Behavioral: Negative.        Objective:   Physical Exam Vitals signs and nursing note reviewed.  Constitutional:  General: She is not in acute distress.    Appearance: Normal appearance. She is well-developed. She is obese. She is not ill-appearing.     Comments: Patient is pleasant and alert and promises to do better with diet and exercise and low-sodium  HENT:     Head: Normocephalic and atraumatic.     Right Ear: Tympanic membrane, ear canal and external ear normal. There is no impacted cerumen.     Left Ear: Tympanic membrane, ear canal and external ear normal. There is no impacted cerumen.     Nose: Nose normal.     Mouth/Throat:     Mouth: Mucous  membranes are moist.     Pharynx: Oropharynx is clear. No oropharyngeal exudate or posterior oropharyngeal erythema.  Eyes:     General: No scleral icterus.       Right eye: No discharge.        Left eye: No discharge.     Extraocular Movements: Extraocular movements intact.     Conjunctiva/sclera: Conjunctivae normal.     Pupils: Pupils are equal, round, and reactive to light.  Neck:     Musculoskeletal: Normal range of motion and neck supple.     Thyroid: No thyromegaly.     Vascular: No carotid bruit or JVD.  Cardiovascular:     Rate and Rhythm: Normal rate and regular rhythm.     Pulses: Normal pulses.     Heart sounds: Normal heart sounds. No murmur.  Pulmonary:     Effort: Pulmonary effort is normal. No respiratory distress.     Breath sounds: Normal breath sounds. No wheezing or rales.  Abdominal:     General: Bowel sounds are normal.     Palpations: Abdomen is soft. There is no mass.     Tenderness: There is no abdominal tenderness.     Comments: Obese without masses tenderness organ enlargement or bruits  Musculoskeletal: Normal range of motion.        General: No tenderness.     Right lower leg: No edema.     Left lower leg: No edema.  Lymphadenopathy:     Cervical: No cervical adenopathy.  Skin:    General: Skin is warm and dry.     Findings: No rash.  Neurological:     General: No focal deficit present.     Mental Status: She is alert and oriented to person, place, and time. Mental status is at baseline.     Cranial Nerves: No cranial nerve deficit.     Deep Tendon Reflexes: Reflexes are normal and symmetric. Reflexes normal.  Psychiatric:        Mood and Affect: Mood normal.        Behavior: Behavior normal.        Thought Content: Thought content normal.        Judgment: Judgment normal.     Comments: Mood affect and behavior are normal for her.    BP 122/74 (BP Location: Left Arm)   Pulse 80   Temp 97.7 F (36.5 C) (Oral)   Ht '5\' 1"'  (1.549 m)   Wt  189 lb (85.7 kg)   BMI 35.71 kg/m        Assessment & Plan:  1. Pure hypercholesterolemia -Continue Crestor pending results of lab work - BMP8+EGFR - CBC with Differential/Platelet - Lipid panel - Hepatic function panel  2. Gastroesophageal reflux disease, esophagitis presence not specified -Watch diet more closely, avoid fried foods.  Take Pepcid AC as needed before  breakfast and supper - CBC with Differential/Platelet - Hepatic function panel  3. Vitamin D deficiency -Continue vitamin D replacement pending results of lab work - CBC with Differential/Platelet - VITAMIN D 25 Hydroxy (Vit-D Deficiency, Fractures)  4. Metabolic syndrome -Exercise and diet to achieve weight loss.  Patient can do this. - BMP8+EGFR - CBC with Differential/Platelet  5.  Shortness of breath--dyspnea on exertion -Follow-up with pulmonologist for further studies  Meds ordered this encounter  Medications  . rosuvastatin (CRESTOR) 10 MG tablet    Sig: Take 1 tablet (10 mg total) by mouth daily.    Dispense:  90 tablet    Refill:  3   Patient Instructions                       Medicare Annual Wellness Visit  [facility] and the medical providers at [facility] strive to bring you the best medical care.  In doing so we not only want to address your current medical conditions and concerns but also to detect new conditions early and prevent illness, disease and health-related problems.    Medicare offers a yearly Wellness Visit which allows our clinical staff to assess your need for preventative services including immunizations, lifestyle education, counseling to decrease risk of preventable diseases and screening for fall risk and other medical concerns.    This visit is provided free of charge (no copay) for all Medicare recipients. The clinical pharmacists at [facility] have begun to conduct these Wellness Visits which will also include a thorough  review of all your medications.    As you primary medical provider recommend that you make an appointment for your Annual Wellness Visit if you have not done so already this year.  You may set up this appointment before you leave today or you may call back ([phone]) and schedule an appointment.  Please make sure when you call that you mention that you are scheduling your Annual Wellness Visit with the clinical pharmacist so that the appointment may be made for the proper length of time.     Continue current medications. Continue good therapeutic lifestyle changes which include good diet and exercise. Fall precautions discussed with patient. If an FOBT was given today- please return it to our front desk. If you are over 39 years old - you may need Prevnar 70 or the adult Pneumonia vaccine.  **Flu shots are available--- please call and schedule a FLU-CLINIC appointment**  After your visit with [place] today you will receive a survey in the mail or online from [employer] regarding your care with [place]. Please take a moment to fill this out. Your feedback is very important to [place] as you can help [place] better understand your patient needs as well as improve your experience and satisfaction. WE CARE ABOUT YOU!!!   Call the pulmonologist again and make arrangements to follow through on the previous studies that he recommended Leave off milk cheese ice cream and dairy products Drink flavored water with no calories and no sodium Make a special effort to eat less bread and carbs You can do this. We will recheck you again in 4 weeks.  Arrie Senate MD

## 2018-11-27 LAB — LIPID PANEL
Chol/HDL Ratio: 3.1 ratio (ref 0.0–4.4)
Cholesterol, Total: 152 mg/dL (ref 100–199)
HDL: 49 mg/dL (ref >39)
LDL Calculated: 68 mg/dL (ref 0–99)
TRIGLYCERIDES: 175 mg/dL — AB (ref 0–149)
VLDL Cholesterol Cal: 35 mg/dL (ref 5–40)

## 2018-11-27 LAB — BMP8+EGFR
BUN/Creatinine Ratio: 18 (ref 9–23)
BUN: 13 mg/dL (ref 6–24)
CALCIUM: 9.1 mg/dL (ref 8.7–10.2)
CO2: 21 mmol/L (ref 20–29)
CREATININE: 0.73 mg/dL (ref 0.57–1.00)
Chloride: 105 mmol/L (ref 96–106)
GFR calc Af Amer: 106 mL/min/{1.73_m2} (ref >59)
GFR, EST NON AFRICAN AMERICAN: 92 mL/min/{1.73_m2} (ref >59)
GLUCOSE: 85 mg/dL (ref 65–99)
POTASSIUM: 4.1 mmol/L (ref 3.5–5.2)
Sodium: 142 mmol/L (ref 134–144)

## 2018-11-27 LAB — CBC WITH DIFFERENTIAL/PLATELET
BASOS: 1 %
Basophils Absolute: 0 10*3/uL (ref 0.0–0.2)
EOS (ABSOLUTE): 0.3 10*3/uL (ref 0.0–0.4)
Eos: 5 %
Hematocrit: 35.6 % (ref 34.0–46.6)
Hemoglobin: 12.3 g/dL (ref 11.1–15.9)
IMMATURE GRANULOCYTES: 0 %
Immature Grans (Abs): 0 10*3/uL (ref 0.0–0.1)
LYMPHS: 41 %
Lymphocytes Absolute: 2.3 10*3/uL (ref 0.7–3.1)
MCH: 32.4 pg (ref 26.6–33.0)
MCHC: 34.6 g/dL (ref 31.5–35.7)
MCV: 94 fL (ref 79–97)
Monocytes Absolute: 0.5 10*3/uL (ref 0.1–0.9)
Monocytes: 9 %
NEUTROS PCT: 44 %
Neutrophils Absolute: 2.4 10*3/uL (ref 1.4–7.0)
PLATELETS: 181 10*3/uL (ref 150–450)
RBC: 3.8 x10E6/uL (ref 3.77–5.28)
RDW: 13 % (ref 11.7–15.4)
WBC: 5.4 10*3/uL (ref 3.4–10.8)

## 2018-11-27 LAB — HEPATIC FUNCTION PANEL
ALT: 16 IU/L (ref 0–32)
AST: 16 IU/L (ref 0–40)
Albumin: 4.5 g/dL (ref 3.8–4.9)
Alkaline Phosphatase: 81 IU/L (ref 39–117)
BILIRUBIN, DIRECT: 0.14 mg/dL (ref 0.00–0.40)
Bilirubin Total: 0.4 mg/dL (ref 0.0–1.2)
Total Protein: 6.7 g/dL (ref 6.0–8.5)

## 2018-11-27 LAB — VITAMIN D 25 HYDROXY (VIT D DEFICIENCY, FRACTURES): VIT D 25 HYDROXY: 54.2 ng/mL (ref 30.0–100.0)

## 2018-12-29 ENCOUNTER — Ambulatory Visit: Payer: BLUE CROSS/BLUE SHIELD | Admitting: Family Medicine

## 2019-02-24 DIAGNOSIS — R87610 Atypical squamous cells of undetermined significance on cytologic smear of cervix (ASC-US): Secondary | ICD-10-CM | POA: Diagnosis not present

## 2019-04-26 ENCOUNTER — Encounter: Payer: Self-pay | Admitting: Pulmonary Disease

## 2019-04-26 ENCOUNTER — Ambulatory Visit (INDEPENDENT_AMBULATORY_CARE_PROVIDER_SITE_OTHER): Payer: BLUE CROSS/BLUE SHIELD | Admitting: Pulmonary Disease

## 2019-04-26 ENCOUNTER — Other Ambulatory Visit: Payer: Self-pay

## 2019-04-26 VITALS — BP 118/70 | HR 76 | Temp 98.1°F | Ht 60.0 in | Wt 184.8 lb

## 2019-04-26 DIAGNOSIS — G4733 Obstructive sleep apnea (adult) (pediatric): Secondary | ICD-10-CM | POA: Diagnosis not present

## 2019-04-26 DIAGNOSIS — R0602 Shortness of breath: Secondary | ICD-10-CM | POA: Diagnosis not present

## 2019-04-26 NOTE — Progress Notes (Signed)
Rachel Larson    [id number]    07/15/1961  Primary Care Physician:Moore, Estella Husk, MD  Referring Physician: Follows up with Dr. Laurance Flatten Chief complaint:   Shortness of breath  Mild obstructive sleep apnea with moderate oxygen desaturation  HPI: She has felt about the same Diagnosed with moderate obstructive sleep apnea, has not started CPAP therapy In lab titration study was recommended The cost of in lab titration is prohibitive for patient And auto titrating CPAP was discussed with oximetry monitoring  She does have occasional chest discomfort Heavy breathing  Quit smoking many years back in 1999 She still does have significant shortness of breath with activity  PFT from 2019 was within normal limits Chest x-ray from 2019 within normal limits  Quit smoking many years back in 1999 History of reflux Sore throats History of significant snoring Nonrestorative sleep  Occupation: No pertinent predisposition Smoking history: Reformed smoker  Outpatient Encounter Medications as of 57/03/2019  Medication Sig  . albuterol (PROVENTIL HFA;VENTOLIN HFA) 108 (90 Base) MCG/ACT inhaler Inhale 2 puffs into the lungs every 6 (six) hours as needed for wheezing or shortness of breath.  Marland Kitchen BIOTIN PO Take 1 capsule by mouth daily.  . rosuvastatin (CRESTOR) 10 MG tablet Take 1 tablet (10 mg total) by mouth daily.  Marland Kitchen VITAMIN D, CHOLECALCIFEROL, PO Take 1 tablet by mouth 2 (two) times a week.   . estradiol (ESTRACE) 0.1 MG/GM vaginal cream   . fluticasone (FLONASE) 50 MCG/ACT nasal spray Place 2 sprays into both nostrils daily. (Patient not taking: Reported on 04/26/2019)   No facility-administered encounter medications on file as of 57/03/2019.     Allergies as of 04/26/2019 - Review Complete 04/26/2019  Allergen Reaction Noted  . Sulfonamide derivatives Hives and Itching 06/26/2011    Past Medical History:  Diagnosis Date  . Baker cyst    rt knee  . Chest pain   .  Hyperlipidemia   . Reflux     Past Surgical History:  Procedure Laterality Date  . CHOLECYSTECTOMY    . NASAL SINUS SURGERY      Family History  Problem Relation Age of Onset  . Diabetes Mother   . Hypertension Brother   . Diabetes type II Brother   . Heart failure Maternal Grandmother        Died age 46  . Hypertension Brother   . Diabetes type II Brother   . Colon cancer Neg Hx     Social History   Socioeconomic History  . Marital status: Married    Spouse name: Not on file  . Number of children: 2  . Years of education: Not on file  . Highest education level: Not on file  Occupational History  . Occupation: Press photographer     Comment: Copy  Social Needs  . Financial resource strain: Not on file  . Food insecurity    Worry: Not on file    Inability: Not on file  . Transportation needs    Medical: Not on file    Non-medical: Not on file  Tobacco Use  . Smoking status: Former Smoker    Packs/day: 1.00    Years: 18.00    Pack years: 18.00    Types: Cigarettes    Start date: 10/24/1977    Quit date: 07/09/1998    Years since quitting: 20.8  . Smokeless tobacco: Never Used  Substance and Sexual Activity  . Alcohol use: No  Frequency: Never    Comment: occasionally  . Drug use: No  . Sexual activity: Not on file  Lifestyle  . Physical activity    Days per week: Not on file    Minutes per session: Not on file  . Stress: Not on file  Relationships  . Social Herbalist on phone: Not on file    Gets together: Not on file    Attends religious service: Not on file    Active member of club or organization: Not on file    Attends meetings of clubs or organizations: Not on file    Relationship status: Not on file  . Intimate partner violence    Fear of current or ex partner: Not on file    Emotionally abused: Not on file    Physically abused: Not on file    Forced sexual activity: Not on file  Other Topics Concern  . Not on file   Social History Narrative   Lives with husband    Review of Systems  Constitutional: Negative.   HENT: Negative.   Eyes: Negative.   Respiratory: Positive for shortness of breath. Negative for cough.   Cardiovascular: Negative.  Negative for leg swelling.  Gastrointestinal: Negative.   Endocrine: Negative.     Vitals:   04/26/19 1616  BP: 118/70  Pulse: 76  Temp: 98.1 F (36.7 C)  SpO2: 95%     Physical Exam  Constitutional: She is oriented to person, place, and time. She appears well-developed and well-nourished.  HENT:  Head: Normocephalic and atraumatic.  Eyes: Pupils are equal, round, and reactive to light. Conjunctivae and EOM are normal. Right eye exhibits no discharge. Left eye exhibits no discharge.  Neck: Normal range of motion. Neck supple. No tracheal deviation present. No thyromegaly present.  Cardiovascular: Normal rate and regular rhythm.  Pulmonary/Chest: Effort normal and breath sounds normal. No respiratory distress. She has no wheezes. She has no rales. She exhibits no tenderness.  Abdominal: Soft. Bowel sounds are normal. She exhibits no distension. There is no abdominal tenderness.  Musculoskeletal: Normal range of motion.        General: No deformity or edema.  Neurological: She is alert and oriented to person, place, and time. She has normal reflexes. No cranial nerve deficit.  Skin: Skin is warm and dry.  Psychiatric: She has a normal mood and affect.   Data Reviewed: Most recent lab data from 05/27/2018 reviewed, normal Chem-7, normal CBC PFT 07/06/2018 was within normal limits Chest x-ray 06/24/2018 within normal limits  Assessment:   Mild obstructive sleep apnea with moderate oxygen desaturations  Coronary artery disease -Follows up with cardiology -Had a recent stress test -Encouraged to call for follow-up especially with a recent chest pressure  Shortness of breath  -With significant exertion  Normal PFT recently    Plan/Recommendations:  Pathophysiology of sleep disordered breathing discussed with the patient -We will initiate CPAP, auto titrating CPAP pressure 5-15 with heated humidification -Monitor oxygen saturations while on CPAP  Regular exercise is encouraged  encouraged to call with any significant concerns  [name] MD [facility] 04/26/2019, 4:59 PM  CC: Chipper Herb, MD

## 2019-04-26 NOTE — Addendum Note (Signed)
Addended by: Joella Prince on: 04/26/2019 05:18 PM   Modules accepted: Orders

## 2019-04-26 NOTE — Patient Instructions (Signed)
Mild obstructive sleep apnea with moderate oxygen desaturations  We will set you up for an auto titrating CPAP Settings of 5-15 with heated humidification  Obtain an overnight oximetry with CPAP  Encourage graded, regular exercises  I will see you back in the office in 3 months  Call to follow-up with your cardiologist regarding the chest pressure you had recently  Call with significant concerns

## 2019-04-28 DIAGNOSIS — G4733 Obstructive sleep apnea (adult) (pediatric): Secondary | ICD-10-CM | POA: Diagnosis not present

## 2019-04-29 ENCOUNTER — Encounter: Payer: Self-pay | Admitting: Pulmonary Disease

## 2019-05-07 DIAGNOSIS — H02834 Dermatochalasis of left upper eyelid: Secondary | ICD-10-CM | POA: Diagnosis not present

## 2019-05-07 DIAGNOSIS — H25813 Combined forms of age-related cataract, bilateral: Secondary | ICD-10-CM | POA: Diagnosis not present

## 2019-05-07 DIAGNOSIS — H04123 Dry eye syndrome of bilateral lacrimal glands: Secondary | ICD-10-CM | POA: Diagnosis not present

## 2019-05-07 DIAGNOSIS — H02831 Dermatochalasis of right upper eyelid: Secondary | ICD-10-CM | POA: Diagnosis not present

## 2019-05-07 DIAGNOSIS — H527 Unspecified disorder of refraction: Secondary | ICD-10-CM | POA: Diagnosis not present

## 2019-05-19 ENCOUNTER — Telehealth: Payer: Self-pay | Admitting: Cardiology

## 2019-05-19 NOTE — Telephone Encounter (Signed)
New Message    Patient states she's been having numbness in her jaw going down her neck and would like to speak to someone about the issue.

## 2019-05-19 NOTE — Progress Notes (Signed)
Cardiology Office Note:    Date:  05/20/2019   ID:  Rachel Larson, DOB 19-May-1961, MRN [id number]  PCP:  Janora Norlander, DO  Cardiologist:  Minus Breeding, MD   Referring MD: Janora Norlander, DO   Chief Complaint  Patient presents with  . Follow-up  . Chest Pain    Pressure.  . Shortness of Breath  . Edema    History of Present Illness:    Rachel Larson is a 58 y.o. female with a hx of OSA recently started on CPAP, former smoker, and hyperlipidemia. She last saw Dr. Percival Spanish in 2018 when she was referred by PCP for chest pain. POET at that time did not reveal evidence of ischemia.   She called our office stating that she was having intermittent chest pain with numbness in her jaw and radiation to her arm. She declined going to he ER since this chest pain has been occurring for the past 2 years. She was placed on my schedule.   On presentation, she reports chest pressure the last week in June that lasted 3-4 hrs. The CP was constant. She stayed in bed and rolled over and felt a pop in her chest and the pressure immediately went away. She has chronic SOB for the last three years. She has recently started CPAP therapy 1 month ago. CP started right after she woke up. CP describes as a pressure and heaviness. The heaviness persisted throughout the evening. CP rated as a 7/10. Occasionally she feels numbness and tingling that radiates up her left arm and left neck. She does have a history of GERD but is not taking a PPI at this time. Was taking nexium, but PCP stopped this for her bone health. She also has some DOE. She has recently obtained a stationary bike and can ride for about 10 min, she is trying to work up to 30 min per day. She gets extremely SOB about thee minutes into exercising and also when she does housework.   Risk factors include HLD, former smoker, OSA, and obesity. She has a family history of heart disease in her maternal grandmother.   Of note, she checks her  BG at home and is 98-113 fasting. She is concerned about possible diabetes. I will check an A1c. Mother and two brothers have diabetes.    Past Medical History:  Diagnosis Date  . Baker cyst    rt knee  . Chest pain   . Hyperlipidemia   . Reflux     Past Surgical History:  Procedure Laterality Date  . CHOLECYSTECTOMY    . NASAL SINUS SURGERY      Current Medications: No outpatient medications have been marked as taking for the 05/20/19 encounter (Office Visit) with Ledora Bottcher, [place].     Allergies:   Sulfonamide derivatives   Social History   Socioeconomic History  . Marital status: Married    Spouse name: Not on file  . Number of children: 2  . Years of education: Not on file  . Highest education level: Not on file  Occupational History  . Occupation: Press photographer     Comment: Copy  Social Needs  . Financial resource strain: Not on file  . Food insecurity    Worry: Not on file    Inability: Not on file  . Transportation needs    Medical: Not on file    Non-medical: Not on file  Tobacco Use  . Smoking status: Former Smoker  Packs/day: 1.00    Years: 18.00    Pack years: 18.00    Types: Cigarettes    Start date: 10/24/1977    Quit date: 07/09/1998    Years since quitting: 20.8  . Smokeless tobacco: Never Used  Substance and Sexual Activity  . Alcohol use: No    Frequency: Never    Comment: occasionally  . Drug use: No  . Sexual activity: Not on file  Lifestyle  . Physical activity    Days per week: Not on file    Minutes per session: Not on file  . Stress: Not on file  Relationships  . Social Herbalist on phone: Not on file    Gets together: Not on file    Attends religious service: Not on file    Active member of club or organization: Not on file    Attends meetings of clubs or organizations: Not on file    Relationship status: Not on file  Other Topics Concern  . Not on file  Social History Narrative   Lives with  husband     Family History: The patient's family history includes Diabetes in her mother; Diabetes type II in her brother and brother; Heart failure in her maternal grandmother; Hypertension in her brother and brother. There is no history of Colon cancer.  ROS:   Please see the history of present illness.     All other systems reviewed and are negative.  EKGs/Labs/Other Studies Reviewed:    The following studies were reviewed today:  POET 07/24/17: Negative POET   EKG:  EKG is ordered today.  The ekg ordered today demonstrates sinus rhythm with HR 61, TWI inversion and Q waves in inferior leads, poor R wave progression - similar to prior tracings  Recent Labs: 11/26/2018: ALT 16; BUN 13; Creatinine, Ser 0.73; Hemoglobin 12.3; Platelets 181; Potassium 4.1; Sodium 142  Recent Lipid Panel    Component Value Date/Time   CHOL 152 11/26/2018 1642   TRIG 175 (H) 11/26/2018 1642   TRIG 133 09/09/2016 0932   HDL 49 11/26/2018 1642   HDL 54 09/09/2016 0932   CHOLHDL 3.1 11/26/2018 1642   LDLCALC 68 11/26/2018 1642   LDLCALC 92 07/29/2014 1057    Physical Exam:    VS:  BP 112/74 (BP Location: Left Arm, Patient Position: Sitting, Cuff Size: Normal)   Pulse 61   Temp 97.7 F (36.5 C)   Ht 5' (1.524 m)   Wt 187 lb (84.8 kg)   BMI 36.52 kg/m     Wt Readings from Last 3 Encounters:  05/20/19 187 lb (84.8 kg)  04/26/19 184 lb 12.8 oz (83.8 kg)  11/26/18 189 lb (85.7 kg)     GEN:  Well nourished, well developed in no acute distress HEENT: Normal NECK: No JVD; No carotid bruits LYMPHATICS: No lymphadenopathy CARDIAC: RRR, no murmurs, rubs, gallops RESPIRATORY:  Clear to auscultation without rales, wheezing or rhonchi  ABDOMEN: Soft, non-tender, non-distended MUSCULOSKELETAL:  No edema; No deformity  SKIN: Warm and dry NEUROLOGIC:  Alert and oriented x 3 PSYCHIATRIC:  Normal affect   ASSESSMENT:    1. SOB (shortness of breath)   2. Dyspnea on exertion   3. Chest pain,  unspecified type   4. Pure hypercholesterolemia   5. OSA on CPAP   6. Obesity due to excess calories, unspecified classification, unspecified whether serious comorbidity present   7. Former smoker    PLAN:    In order of problems  listed above:  Chest pain, precordial DOE She describes typical and atypical features of chest pain. Given her abnormal EKG and risk factors along, I will proceed with CT coronary with FFR. HR is 61 today. She has normal renal function.    Hyperlipidemia 11/26/2018: Cholesterol, Total 152; HDL 49; LDL Calculated 68; Triglycerides 175 Continue on 10 mg crestor   OSA on CPAP Recently started CPAP last month. She is wearing on average about 4 hrs per night.    Risk factor modification She is very concerned about diabetes given her family history. She states she has recording readings above 100 when checking her fasting BG at home. I will check an A1c when she comes for her BMP prior to CTA.    Follow up in 6 months, or sooner if CTA is abnormal.    Medication Adjustments/Labs and Tests Ordered: Current medicines are reviewed at length with the patient today.  Concerns regarding medicines are outlined above.  Orders Placed This Encounter  Procedures  . CT CORONARY MORPH W/CTA COR W/SCORE W/CA W/CM &/OR WO/CM  . CT CORONARY FRACTIONAL FLOW RESERVE DATA PREP  . CT CORONARY FRACTIONAL FLOW RESERVE FLUID ANALYSIS  . Basic metabolic panel  . Hemoglobin A1c  . EKG 12-Lead   No orders of the defined types were placed in this encounter.   Signed, Tami Lin Marilin Kofman, PA  05/20/2019 1:06 PM    [facility]

## 2019-05-19 NOTE — Telephone Encounter (Signed)
Call placed to the patient. She stated that she has had intermittent chest pressure and numbness in her jaw that radiates to her arm. She stated that this has been going on for the past two years. The last episode of the numbness occurred this morning. These episodes last around an hour. She has been advised that she should go to the ED for further evaluation but declined since "this has been going on for two years." She was last seen by Dr. Percival Spanish in 2018.  An appointment has been made for her tomorrow with a PA. She has been advised that if the pressure or numbness returns then she needs to go to the ED.      COVID-19 Pre-Screening Questions:  . In the past 7 to 10 days have you had a cough,  shortness of breath, headache, congestion, fever (100 or greater) body aches, chills, sore throat, or sudden loss of taste or sense of smell? No . Have you been around anyone with known Covid 19. No . Have you been around anyone who is awaiting Covid 19 test results in the past 7 to 10 days? No . Have you been around anyone who has been exposed to Covid 19, or has mentioned symptoms of Covid 19 within the past 7 to 10 days? No  If you have any concerns/questions about symptoms patients report during screening (either on the phone or at threshold). Contact the provider seeing the patient or DOD for further guidance.  If neither are available contact a member of the leadership team.

## 2019-05-20 ENCOUNTER — Ambulatory Visit: Payer: BLUE CROSS/BLUE SHIELD | Admitting: Physician Assistant

## 2019-05-20 ENCOUNTER — Other Ambulatory Visit: Payer: Self-pay

## 2019-05-20 ENCOUNTER — Encounter: Payer: Self-pay | Admitting: Physician Assistant

## 2019-05-20 VITALS — BP 112/74 | HR 61 | Temp 97.7°F | Ht 60.0 in | Wt 187.0 lb

## 2019-05-20 DIAGNOSIS — G4733 Obstructive sleep apnea (adult) (pediatric): Secondary | ICD-10-CM

## 2019-05-20 DIAGNOSIS — R079 Chest pain, unspecified: Secondary | ICD-10-CM

## 2019-05-20 DIAGNOSIS — R0602 Shortness of breath: Secondary | ICD-10-CM

## 2019-05-20 DIAGNOSIS — E7800 Pure hypercholesterolemia, unspecified: Secondary | ICD-10-CM | POA: Diagnosis not present

## 2019-05-20 DIAGNOSIS — Z87891 Personal history of nicotine dependence: Secondary | ICD-10-CM

## 2019-05-20 DIAGNOSIS — E6609 Other obesity due to excess calories: Secondary | ICD-10-CM

## 2019-05-20 DIAGNOSIS — R0600 Dyspnea, unspecified: Secondary | ICD-10-CM | POA: Insufficient documentation

## 2019-05-20 DIAGNOSIS — R0609 Other forms of dyspnea: Secondary | ICD-10-CM | POA: Diagnosis not present

## 2019-05-20 DIAGNOSIS — Z9989 Dependence on other enabling machines and devices: Secondary | ICD-10-CM

## 2019-05-20 NOTE — Patient Instructions (Signed)
Testing/Procedures:  You will need to have blood work done within 1 week prior to your scheduled CTA. You do not need an appointment for this when coming to our office for blood work. Please bring your lab slips with you.   Your cardiac CT will be scheduled at one of the below locations:   [facility] [address] [place], [place] [address] (336) [place] [address] [place], [place] [address] [phone]  Please arrive at the [facility] main entrance of [facility] 30-45 minutes prior to test start time. Proceed to the [facility] Radiology Department (first floor) to check-in and test prep.  Please follow these instructions carefully (unless otherwise directed):  On the Night Before the Test: . Be sure to Drink plenty of water. . Do not consume any caffeinated/decaffeinated beverages or chocolate 12 hours prior to your test. . Do not take any antihistamines 12 hours prior to your test.  On the Day of the Test: . Drink plenty of water. Do not drink any water within one hour of the test. . Do not eat any food 4 hours prior to the test. . You may take your regular medications prior to the test.  . Take metoprolol (Lopressor) two hours prior to test. . FEMALES- please wear underwire-free bra if available       After the Test: . Drink plenty of water. . After receiving IV contrast, you may experience a mild flushed feeling. This is normal. . On occasion, you may experience a mild rash up to 24 hours after the test. This is not dangerous. If this occurs, you can take Benadryl 25 mg and increase your fluid intake. . If you experience trouble breathing, this can be serious. If it is severe call 911 IMMEDIATELY. If it is mild, please call our office. . If you take any of these medications: Glipizide/Metformin, Avandament, Glucavance, please do not take 48 hours after completing test.  Please  contact the cardiac imaging nurse navigator should you have any questions/concerns Marchia Bond, RN Navigator Cardiac Imaging [facility] [phone] Office  [phone] Cell    Follow-Up: At [facility], you and your health needs are our priority.  As part of our continuing mission to provide you with exceptional heart care, we have created designated Provider Care Teams.  These Care Teams include your primary Cardiologist (physician) and Advanced Practice Providers (APPs -  Physician Assistants and Nurse Practitioners) who all work together to provide you with the care you need, when you need it. You will need a follow up appointment in 6 months.  Please call our office 2 months in advance to schedule this appointment.  You may see Minus Breeding, MD or one of the following Advanced Practice Providers on your designated Care Team:   Rosaria Ferries, PA-C . Jory Sims, DNP, ANP  Any Other Special Instructions Will Be Listed Below (If Applicable). None

## 2019-05-24 DIAGNOSIS — R079 Chest pain, unspecified: Secondary | ICD-10-CM | POA: Diagnosis not present

## 2019-05-24 DIAGNOSIS — E7800 Pure hypercholesterolemia, unspecified: Secondary | ICD-10-CM | POA: Diagnosis not present

## 2019-05-24 DIAGNOSIS — R0602 Shortness of breath: Secondary | ICD-10-CM | POA: Diagnosis not present

## 2019-05-25 ENCOUNTER — Telehealth (HOSPITAL_COMMUNITY): Payer: Self-pay | Admitting: Emergency Medicine

## 2019-05-25 LAB — BASIC METABOLIC PANEL
BUN/Creatinine Ratio: 12 (ref 9–23)
BUN: 10 mg/dL (ref 6–24)
CO2: 23 mmol/L (ref 20–29)
Calcium: 10 mg/dL (ref 8.7–10.2)
Chloride: 104 mmol/L (ref 96–106)
Creatinine, Ser: 0.81 mg/dL (ref 0.57–1.00)
GFR calc Af Amer: 94 mL/min/{1.73_m2} (ref >59)
GFR calc non Af Amer: 81 mL/min/{1.73_m2} (ref >59)
Glucose: 83 mg/dL (ref 65–99)
Potassium: 4.7 mmol/L (ref 3.5–5.2)
Sodium: 143 mmol/L (ref 134–144)

## 2019-05-25 LAB — HEMOGLOBIN A1C
Est. average glucose Bld gHb Est-mCnc: 108 mg/dL
Hgb A1c MFr Bld: 5.4 % (ref 4.8–5.6)

## 2019-05-25 NOTE — Telephone Encounter (Signed)
Reaching out to patient to offer assistance regarding upcoming cardiac imaging study; pt verbalizes understanding of appt date/time, parking situation and where to check in, pre-test NPO status and medications ordered, and verified current allergies; name and call back number provided for further questions should they arise Keiosha Cancro RN Navigator Cardiac Imaging [facility] [phone] office [phone] cell  Pt denies covid symptoms, verbalized understanding of visitor policy. 

## 2019-05-26 ENCOUNTER — Ambulatory Visit (HOSPITAL_COMMUNITY)
Admission: RE | Admit: 2019-05-26 | Discharge: 2019-05-26 | Disposition: A | Discharge status: Home / Self Care | Payer: BLUE CROSS/BLUE SHIELD | Source: Ambulatory Visit | Attending: Physician Assistant | Admitting: Physician Assistant

## 2019-05-26 ENCOUNTER — Encounter (HOSPITAL_COMMUNITY): Payer: Self-pay

## 2019-05-26 ENCOUNTER — Other Ambulatory Visit: Payer: Self-pay

## 2019-05-26 DIAGNOSIS — E7800 Pure hypercholesterolemia, unspecified: Secondary | ICD-10-CM

## 2019-05-26 DIAGNOSIS — R079 Chest pain, unspecified: Secondary | ICD-10-CM | POA: Insufficient documentation

## 2019-05-26 DIAGNOSIS — R0602 Shortness of breath: Secondary | ICD-10-CM | POA: Insufficient documentation

## 2019-05-26 MED ORDER — METOPROLOL TARTRATE 5 MG/5ML IV SOLN
INTRAVENOUS | Status: AC
  Filled 2019-05-26: qty 5

## 2019-05-26 MED ORDER — NITROGLYCERIN 0.4 MG SL SUBL
SUBLINGUAL_TABLET | SUBLINGUAL | Status: AC
  Filled 2019-05-26: qty 2

## 2019-05-26 MED ORDER — NITROGLYCERIN 0.4 MG SL SUBL
0.8000 mg | SUBLINGUAL_TABLET | Freq: Once | SUBLINGUAL | Status: AC
  Administered 2019-05-26: 0.8 mg via SUBLINGUAL
  Filled 2019-05-26: qty 25

## 2019-05-26 MED ORDER — METOPROLOL TARTRATE 5 MG/5ML IV SOLN
5.0000 mg | INTRAVENOUS | Status: DC | PRN
  Administered 2019-05-26: 5 mg via INTRAVENOUS
  Filled 2019-05-26 (×2): qty 5

## 2019-05-26 MED ORDER — IOHEXOL 350 MG/ML SOLN
80.0000 mL | Freq: Once | INTRAVENOUS | Status: AC | PRN
  Administered 2019-05-26: 80 mL via INTRAVENOUS

## 2019-05-29 DIAGNOSIS — G4733 Obstructive sleep apnea (adult) (pediatric): Secondary | ICD-10-CM | POA: Diagnosis not present
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
## 2019-06-02 ENCOUNTER — Ambulatory Visit: Payer: BLUE CROSS/BLUE SHIELD | Admitting: Family Medicine

## 2019-06-11 ENCOUNTER — Ambulatory Visit: Payer: BLUE CROSS/BLUE SHIELD | Admitting: Family Medicine

## 2019-06-11 ENCOUNTER — Ambulatory Visit (INDEPENDENT_AMBULATORY_CARE_PROVIDER_SITE_OTHER): Payer: BLUE CROSS/BLUE SHIELD | Admitting: Family Medicine

## 2019-06-11 DIAGNOSIS — E7800 Pure hypercholesterolemia, unspecified: Secondary | ICD-10-CM | POA: Diagnosis not present

## 2019-06-11 NOTE — Progress Notes (Signed)
Telephone visit  Subjective: CC: HLD PCP: Janora Norlander, DO [id number] E Rachel Larson is a 58 y.o. female calls for telephone consult today. Patient provides verbal consent for consult held via phone.  Location of patient: parked Location of provider: [facility] Others present for call: spouse  1. HLD Patient reports compliance with Crestor 10 mg daily.  She takes this with a meal.  Denies any myalgia, abdominal pain or nausea.  She tolerates the medicine without difficulty.  At her last visit in February with her PCP she had triglycerides that were slightly elevated at 175.  She worries that she will have to take an additional medication to cover these as there was discussion for possibly adding Vascepa.  She does not desire to go on additional medication if possible.   ROS: Per HPI  Allergies  Allergen Reactions  . Sulfonamide Derivatives Hives and Itching   Past Medical History:  Diagnosis Date  . Baker cyst    rt knee  . Chest pain   . Hyperlipidemia   . Reflux     Current Outpatient Medications:  .  albuterol (PROVENTIL HFA;VENTOLIN HFA) 108 (90 Base) MCG/ACT inhaler, Inhale 2 puffs into the lungs every 6 (six) hours as needed for wheezing or shortness of breath., Disp: 18 g, Rfl: 5 .  BIOTIN PO, Take 1 capsule by mouth daily., Disp: , Rfl:  .  estradiol (ESTRACE) 0.1 MG/GM vaginal cream, , Disp: , Rfl:  .  fluticasone (FLONASE) 50 MCG/ACT nasal spray, Place 2 sprays into both nostrils daily. (Patient not taking: Reported on 04/26/2019), Disp: 16 g, Rfl: 6 .  rosuvastatin (CRESTOR) 10 MG tablet, Take 1 tablet (10 mg total) by mouth daily., Disp: 90 tablet, Rfl: 3 .  VITAMIN D, CHOLECALCIFEROL, PO, Take 1 tablet by mouth 2 (two) times a week. , Disp: , Rfl:   Assessment/ Plan: 58 y.o. female   1. Pure hypercholesterolemia Continue Crestor nightly.  We discussed that her triglycerides were only slightly elevated in February.  Okay to repeat fasting in February.  If significantly  elevated could consider adding fish oil/Vascepa.  However, not quite sure that she needs this just yet.  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  We will need to get a release of information form to get this updated for her.   Start time: 8:03am (LVM); 8:08am (no answer); tried home phone at 8:10am (no answer); 8:23am  End time: 8:30am  Total time spent on patient care (including telephone call/ virtual visit): 13 minutes  [place], [place] [phone]

## 2019-06-29 DIAGNOSIS — G4733 Obstructive sleep apnea (adult) (pediatric): Secondary | ICD-10-CM | POA: Diagnosis not present

## 2019-07-01 ENCOUNTER — Other Ambulatory Visit: Payer: Self-pay

## 2019-07-02 ENCOUNTER — Ambulatory Visit (INDEPENDENT_AMBULATORY_CARE_PROVIDER_SITE_OTHER): Payer: BLUE CROSS/BLUE SHIELD

## 2019-07-02 DIAGNOSIS — Z111 Encounter for screening for respiratory tuberculosis: Secondary | ICD-10-CM

## 2019-07-02 DIAGNOSIS — Z23 Encounter for immunization: Secondary | ICD-10-CM | POA: Diagnosis not present

## 2019-07-02 NOTE — Progress Notes (Signed)
PPD test given to right lower arm.  Patient tolerated well.

## 2019-07-05 LAB — TB SKIN TEST
Induration: 0 mm
TB Skin Test: NEGATIVE

## 2019-07-06 NOTE — Progress Notes (Signed)
Cardiology Office Note   Date:  07/08/2019   ID:  Rachel Larson, DOB 08/13/1961, MRN [id number]  PCP:  Janora Norlander, DO  Cardiologist:   Minus Breeding, MD   Chief Complaint  Patient presents with  . Chest Pain      History of Present Illness: Rachel Larson is a 58 y.o. female who presents for evaluation of OSA recently started on CPAP, former smoker, and hyperlipidemia.   I have seen him for chest pain. POET (Plain Old Exercise Treadmill) at that time did not reveal evidence of ischemia.   She was seen again in July with chest pain.   She had a coronary CT and had no CAD or calcium.     Since I last saw her acute cardiovascular complaints.  He has no new chest pressure, neck or arm discomfort.  There is been no new palpitations, presyncope or syncope.  She does have dyspnea on exertion.  She does see a pulmonologist however.  She is being treated for sleep apnea.  She is not been found to have other apparent diagnoses.   Past Medical History:  Diagnosis Date  . Baker cyst    rt knee  . Chest pain   . Hyperlipidemia   . Reflux     Past Surgical History:  Procedure Laterality Date  . CHOLECYSTECTOMY    . NASAL SINUS SURGERY       Current Outpatient Medications  Medication Sig Dispense Refill  . albuterol (PROVENTIL HFA;VENTOLIN HFA) 108 (90 Base) MCG/ACT inhaler Inhale 2 puffs into the lungs every 6 (six) hours as needed for wheezing or shortness of breath. 18 g 5  . rosuvastatin (CRESTOR) 10 MG tablet Take 1 tablet (10 mg total) by mouth daily. 90 tablet 3  . VITAMIN D, CHOLECALCIFEROL, PO Take 1 tablet by mouth 2 (two) times a week.     . estradiol (ESTRACE) 0.1 MG/GM vaginal cream     . fluticasone (FLONASE) 50 MCG/ACT nasal spray Place 2 sprays into both nostrils daily. (Patient not taking: Reported on 04/26/2019) 16 g 6   No current facility-administered medications for this visit.     Allergies:   Sulfonamide derivatives    ROS:  Please see the  history of present illness.   Otherwise, review of systems are positive for none.   All other systems are reviewed and negative.    PHYSICAL EXAM: VS:  BP 118/80   Pulse 80   Ht 5' (1.524 m)   Wt 188 lb (85.3 kg)   BMI 36.72 kg/m  , BMI Body mass index is 36.72 kg/m. GENERAL:  Well appearing NECK:  No jugular venous distention, waveform within normal limits, carotid upstroke brisk and symmetric, questionable left-sided  bruits, no thyromegaly LUNGS:  Clear to auscultation bilaterally CHEST:  Unremarkable HEART:  PMI not displaced or sustained,S1 and S2 within normal limits, no S3, no S4, no clicks, no rubs, no murmurs ABD:  Flat, positive bowel sounds normal in frequency in pitch, no bruits, no rebound, no guarding, no midline pulsatile mass, no hepatomegaly, no splenomegaly EXT:  2 plus pulses throughout, no edema, no cyanosis no clubbing    EKG:  EKG is not ordered today.    Recent Labs: 11/26/2018: ALT 16; Hemoglobin 12.3; Platelets 181 05/24/2019: BUN 10; Creatinine, Ser 0.81; Potassium 4.7; Sodium 143    Lipid Panel    Component Value Date/Time   CHOL 152 11/26/2018 1642   TRIG 175 (H) 11/26/2018 1642  TRIG 133 09/09/2016 0932   HDL 49 11/26/2018 1642   HDL 54 09/09/2016 0932   CHOLHDL 3.1 11/26/2018 1642   LDLCALC 68 11/26/2018 1642   LDLCALC 92 07/29/2014 1057      Wt Readings from Last 3 Encounters:  07/07/19 188 lb (85.3 kg)  05/20/19 187 lb (84.8 kg)  04/26/19 184 lb 12.8 oz (83.8 kg)      Other studies Reviewed: Additional studies/ records that were reviewed today include: CT. Review of the above records demonstrates:  Please see elsewhere in the note.     ASSESSMENT AND PLAN:  Chest pain, precordial/DOE The patient has known coronary artery disease.  No further cardiovascular testing is suggested.  She is seeing a pulmonologist and there is been no clear pulmonary etiology.  This could be weight and deconditioning and we talked about this with  specific instructions.  She can come back if she has continued dyspnea after she is improved her activity level and hopefully had some weight loss.   Hyperlipidemia LDL 68.  No change in therapy.   OSA on CPAP This is per her pulmonologist.  BRUIT Check carotid Doppler.    Current medicines are reviewed at length with the patient today.  The patient does not have concerns regarding medicines.  The following changes have been made:  no change  Labs/ tests ordered today include:   Orders Placed This Encounter  Procedures  . US Carotid Bilateral     Disposition:   FU with me as needed    Signed, Minus Breeding, MD  07/08/2019 7:26 AM     [facility]

## 2019-07-07 ENCOUNTER — Encounter: Payer: Self-pay | Admitting: Cardiology

## 2019-07-07 ENCOUNTER — Ambulatory Visit: Payer: BLUE CROSS/BLUE SHIELD | Admitting: Cardiology

## 2019-07-07 ENCOUNTER — Other Ambulatory Visit: Payer: Self-pay

## 2019-07-07 VITALS — BP 118/80 | HR 80 | Ht 60.0 in | Wt 188.0 lb

## 2019-07-07 DIAGNOSIS — R079 Chest pain, unspecified: Secondary | ICD-10-CM

## 2019-07-07 DIAGNOSIS — R0609 Other forms of dyspnea: Secondary | ICD-10-CM | POA: Diagnosis not present

## 2019-07-07 DIAGNOSIS — E785 Hyperlipidemia, unspecified: Secondary | ICD-10-CM | POA: Diagnosis not present

## 2019-07-07 DIAGNOSIS — R0989 Other specified symptoms and signs involving the circulatory and respiratory systems: Secondary | ICD-10-CM

## 2019-07-07 DIAGNOSIS — R0600 Dyspnea, unspecified: Secondary | ICD-10-CM

## 2019-07-07 NOTE — Patient Instructions (Addendum)
Medication Instructions:  The current medical regimen is effective;  continue present plan and medications.  If you need a refill on your cardiac medications before your next appointment, please call your pharmacy.   Testing/Procedures: Your physician has requested that you have a carotid duplex. This test is an ultrasound of the carotid arteries in your neck. It looks at blood flow through these arteries that supply the brain with blood. Allow one hour for this exam. There are no restrictions or special instructions. This will be completed at [facility].  You will be called to be scheduled.  Follow-Up: . Follow up with Dr Percival Spanish in [place] after your doppler study  Thank you for choosing [facility]!!

## 2019-07-08 ENCOUNTER — Encounter: Payer: Self-pay | Admitting: Cardiology

## 2019-07-27 ENCOUNTER — Ambulatory Visit (HOSPITAL_COMMUNITY): Payer: BLUE CROSS/BLUE SHIELD

## 2019-07-29 ENCOUNTER — Ambulatory Visit: Payer: BLUE CROSS/BLUE SHIELD | Admitting: Family Medicine

## 2019-07-29 DIAGNOSIS — G4733 Obstructive sleep apnea (adult) (pediatric): Secondary | ICD-10-CM | POA: Diagnosis not present

## 2019-07-30 ENCOUNTER — Ambulatory Visit (HOSPITAL_COMMUNITY)
Admission: RE | Admit: 2019-07-30 | Discharge: 2019-07-30 | Disposition: A | Discharge status: Home / Self Care | Payer: BLUE CROSS/BLUE SHIELD | Source: Ambulatory Visit | Attending: Cardiology | Admitting: Cardiology

## 2019-07-30 ENCOUNTER — Other Ambulatory Visit: Payer: Self-pay

## 2019-07-30 DIAGNOSIS — I6523 Occlusion and stenosis of bilateral carotid arteries: Secondary | ICD-10-CM | POA: Diagnosis not present

## 2019-07-30 DIAGNOSIS — R0989 Other specified symptoms and signs involving the circulatory and respiratory systems: Secondary | ICD-10-CM | POA: Insufficient documentation

## 2019-08-13 ENCOUNTER — Telehealth: Payer: Self-pay | Admitting: *Deleted

## 2019-08-13 DIAGNOSIS — I6523 Occlusion and stenosis of bilateral carotid arteries: Secondary | ICD-10-CM

## 2019-08-13 NOTE — Telephone Encounter (Signed)
-----   Message from Minus Breeding, MD sent at 08/01/2019 10:44 AM EDT ----- Less than 50% stenosis of the bilateral ICAs. I will follow in a couple of years with repeat Doppler.   No change in therapy.  Call Ms. Kina with the results and send results to Janora Norlander, DO

## 2019-08-13 NOTE — Telephone Encounter (Signed)
Unable to reach patient  Printed, highlighted Dr Rosezella [place] comments, and mailed to patient

## 2019-09-22 ENCOUNTER — Ambulatory Visit: Payer: BLUE CROSS/BLUE SHIELD | Admitting: Pulmonary Disease

## 2019-09-29 ENCOUNTER — Ambulatory Visit: Payer: BLUE CROSS/BLUE SHIELD | Admitting: Cardiology

## 2019-12-10 ENCOUNTER — Ambulatory Visit: Payer: BLUE CROSS/BLUE SHIELD | Admitting: Family Medicine

## 2019-12-10 ENCOUNTER — Other Ambulatory Visit: Payer: Self-pay

## 2019-12-27 ENCOUNTER — Other Ambulatory Visit: Payer: Self-pay | Admitting: Family Medicine

## 2019-12-29 ENCOUNTER — Ambulatory Visit: Payer: Self-pay | Admitting: Family Medicine

## 2020-01-14 ENCOUNTER — Ambulatory Visit: Payer: BLUE CROSS/BLUE SHIELD | Admitting: Family Medicine

## 2020-01-14 ENCOUNTER — Encounter: Payer: Self-pay | Admitting: Family Medicine

## 2020-01-14 ENCOUNTER — Other Ambulatory Visit: Payer: Self-pay

## 2020-01-14 VITALS — BP 133/88 | HR 96 | Temp 97.8°F | Wt 185.0 lb

## 2020-01-14 DIAGNOSIS — M25561 Pain in right knee: Secondary | ICD-10-CM | POA: Diagnosis not present

## 2020-01-14 DIAGNOSIS — R059 Cough, unspecified: Secondary | ICD-10-CM

## 2020-01-14 DIAGNOSIS — M25571 Pain in right ankle and joints of right foot: Secondary | ICD-10-CM | POA: Diagnosis not present

## 2020-01-14 DIAGNOSIS — E7800 Pure hypercholesterolemia, unspecified: Secondary | ICD-10-CM

## 2020-01-14 DIAGNOSIS — M25562 Pain in left knee: Secondary | ICD-10-CM

## 2020-01-14 DIAGNOSIS — E559 Vitamin D deficiency, unspecified: Secondary | ICD-10-CM | POA: Diagnosis not present

## 2020-01-14 DIAGNOSIS — R05 Cough: Secondary | ICD-10-CM

## 2020-01-14 DIAGNOSIS — M25572 Pain in left ankle and joints of left foot: Secondary | ICD-10-CM

## 2020-01-14 DIAGNOSIS — G8929 Other chronic pain: Secondary | ICD-10-CM

## 2020-01-14 DIAGNOSIS — J301 Allergic rhinitis due to pollen: Secondary | ICD-10-CM

## 2020-01-14 MED ORDER — PANTOPRAZOLE SODIUM 40 MG PO TBEC: 40.0000 mg | DELAYED_RELEASE_TABLET | Freq: Every day | ORAL | 3 refills | Status: DC

## 2020-01-14 MED ORDER — FLUTICASONE PROPIONATE 50 MCG/ACT NA SUSP: 2.0000 | Freq: Every day | NASAL | 6 refills | Status: DC

## 2020-01-14 MED ORDER — ROSUVASTATIN CALCIUM 10 MG PO TABS: 10.0000 mg | ORAL_TABLET | Freq: Every day | ORAL | 3 refills | Status: DC

## 2020-01-14 NOTE — Patient Instructions (Signed)
Minimum 2 week of Protonix (can use longer if needed). If symptoms do not improve at all, please let me know and we will obtain xray and get the lung specialist to look at you.  Lungs sounded totally normal today.  You had labs performed today.  You will be contacted with the results of the labs once they are available, usually in the next 3 business days for routine lab work.  If you have an active my chart account, they will be released to your MyChart.  If you prefer to have these labs released to you via telephone, please let us know.  If you had a pap smear or biopsy performed, expect to be contacted in about 7-10 days.   Gastroesophageal Reflux Disease, Adult Gastroesophageal reflux (GER) happens when acid from the stomach flows up into the tube that connects the mouth and the stomach (esophagus). Normally, food travels down the esophagus and stays in the stomach to be digested. With GER, food and stomach acid sometimes move back up into the esophagus. You may have a disease called gastroesophageal reflux disease (GERD) if the reflux:  Happens often.  Causes frequent or very bad symptoms.  Causes problems such as damage to the esophagus. When this happens, the esophagus becomes sore and swollen (inflamed). Over time, GERD can make small holes (ulcers) in the lining of the esophagus. What are the causes? This condition is caused by a problem with the muscle between the esophagus and the stomach. When this muscle is weak or not normal, it does not close properly to keep food and acid from coming back up from the stomach. The muscle can be weak because of:  Tobacco use.  Pregnancy.  Having a certain type of hernia (hiatal hernia).  Alcohol use.  Certain foods and drinks, such as coffee, chocolate, onions, and peppermint. What increases the risk? You are more likely to develop this condition if you:  Are overweight.  Have a disease that affects your connective tissue.  Use NSAID  medicines. What are the signs or symptoms? Symptoms of this condition include:  Heartburn.  Difficult or painful swallowing.  The feeling of having a lump in the throat.  A bitter taste in the mouth.  Bad breath.  Having a lot of saliva.  Having an upset or bloated stomach.  Belching.  Chest pain. Different conditions can cause chest pain. Make sure you see your doctor if you have chest pain.  Shortness of breath or noisy breathing (wheezing).  Ongoing (chronic) cough or a cough at night.  Wearing away of the surface of teeth (tooth enamel).  Weight loss. How is this treated? Treatment will depend on how bad your symptoms are. Your doctor may suggest:  Changes to your diet.  Medicine.  Surgery. Follow these instructions at home: Eating and drinking   Follow a diet as told by your doctor. You may need to avoid foods and drinks such as: ? Coffee and tea (with or without caffeine). ? Drinks that contain alcohol. ? Energy drinks and sports drinks. ? Bubbly (carbonated) drinks or sodas. ? Chocolate and cocoa. ? Peppermint and mint flavorings. ? Garlic and onions. ? Horseradish. ? Spicy and acidic foods. These include peppers, chili powder, curry powder, vinegar, hot sauces, and BBQ sauce. ? Citrus fruit juices and citrus fruits, such as oranges, lemons, and limes. ? Tomato-based foods. These include red sauce, chili, salsa, and pizza with red sauce. ? Fried and fatty foods. These include donuts, french fries, potato  chips, and high-fat dressings. ? High-fat meats. These include hot dogs, rib eye steak, sausage, ham, and bacon. ? High-fat dairy items, such as whole milk, butter, and cream cheese.  Eat small meals often. Avoid eating large meals.  Avoid drinking large amounts of liquid with your meals.  Avoid eating meals during the 2-3 hours before bedtime.  Avoid lying down right after you eat.  Do not exercise right after you eat. Lifestyle   Do not  use any products that contain nicotine or tobacco. These include cigarettes, e-cigarettes, and chewing tobacco. If you need help quitting, ask your doctor.  Try to lower your stress. If you need help doing this, ask your doctor.  If you are overweight, lose an amount of weight that is healthy for you. Ask your doctor about a safe weight loss goal. General instructions  Pay attention to any changes in your symptoms.  Take over-the-counter and prescription medicines only as told by your doctor. Do not take aspirin, ibuprofen, or other NSAIDs unless your doctor says it is okay.  Wear loose clothes. Do not wear anything tight around your waist.  Raise (elevate) the head of your bed about 6 inches (15 cm).  Avoid bending over if this makes your symptoms worse.  Keep all follow-up visits as told by your doctor. This is important. Contact a doctor if:  You have new symptoms.  You lose weight and you do not know why.  You have trouble swallowing or it hurts to swallow.  You have wheezing or a cough that keeps happening.  Your symptoms do not get better with treatment.  You have a hoarse voice. Get help right away if:  You have pain in your arms, neck, jaw, teeth, or back.  You feel sweaty, dizzy, or light-headed.  You have chest pain or shortness of breath.  You throw up (vomit) and your throw-up looks like blood or coffee grounds.  You pass out (faint).  Your poop (stool) is bloody or black.  You cannot swallow, drink, or eat. Summary  If a person has gastroesophageal reflux disease (GERD), food and stomach acid move back up into the esophagus and cause symptoms or problems such as damage to the esophagus.  Treatment will depend on how bad your symptoms are.  Follow a diet as told by your doctor.  Take all medicines only as told by your doctor. This information is not intended to replace advice given to you by your health care provider. Make sure you discuss any  questions you have with your health care provider. Document Revised: 04/15/2018 Document Reviewed: 04/15/2018 Elsevier Patient Education  [place].

## 2020-01-14 NOTE — Progress Notes (Signed)
Subjective: CC: Hyperlipidemia, foot knee pain PCP: Janora Norlander, DO Rachel Larson is a 59 y.o. female presenting to clinic today for:  1.  Hyperlipidemia/ankle and knee pain Patient reports compliance with Crestor 10 mg daily.  Is not reporting chest pain, shortness of breath.  She does have some occasional lower extremity edema.  This seems to be most prominent at the end of her shift.  She goes on to state that she is been having some longstanding medial ankle pain that radiates to the medial knees bilaterally.  She does not report any sensory changes, injury.  She has been trying to wear supportive shoes like [employer]ria which does help some.  However, usually after a long day she has to keep her legs elevated to reduce swelling and pain in efforts to get her up for the next work week.  She has some custom shoes that she bought from a shoe store in [place] but she is unfortunately not allowed to wear these at work because they are tennis shoes.  She is asking for work note.  2.  Cough Patient reports a persistent cough over the last couple of weeks.  Cough seems to be worse after meals, late in the evening and early in the morning.  She reports some phlegm production with this.  No hemoptysis, fevers, shortness of breath.  She is been tested for Covid x2 which were both have been negative.  She does have a history of GERD but has not been on medicine for this for quite some time now.   ROS: Per HPI  Allergies  Allergen Reactions  . Sulfonamide Derivatives Hives and Itching   Past Medical History:  Diagnosis Date  . Baker cyst    rt knee  . Chest pain   . Hyperlipidemia   . Reflux     Current Outpatient Medications:  .  albuterol (PROVENTIL HFA;VENTOLIN HFA) 108 (90 Base) MCG/ACT inhaler, Inhale 2 puffs into the lungs every 6 (six) hours as needed for wheezing or shortness of breath., Disp: 18 g, Rfl: 5 .  estradiol (ESTRACE) 0.1 MG/GM vaginal cream, , Disp: , Rfl:   .  fluticasone (FLONASE) 50 MCG/ACT nasal spray, Place 2 sprays into both nostrils daily. (Patient not taking: Reported on 04/26/2019), Disp: 16 g, Rfl: 6 .  rosuvastatin (CRESTOR) 10 MG tablet, TAKE 1 TABLET BY MOUTH EVERY DAY, Disp: 30 tablet, Rfl: 0 .  VITAMIN D, CHOLECALCIFEROL, PO, Take 1 tablet by mouth 2 (two) times a week. , Disp: , Rfl:  Social History   Socioeconomic History  . Marital status: Married    Spouse name: Not on file  . Number of children: 2  . Years of education: Not on file  . Highest education level: Not on file  Occupational History  . Occupation: Press photographer     Comment: Copy  Tobacco Use  . Smoking status: Former Smoker    Packs/day: 1.00    Years: 18.00    Pack years: 18.00    Types: Cigarettes    Start date: 10/24/1977    Quit date: 07/09/1998    Years since quitting: 21.5  . Smokeless tobacco: Never Used  Substance and Sexual Activity  . Alcohol use: No    Comment: occasionally  . Drug use: No  . Sexual activity: Not on file  Other Topics Concern  . Not on file  Social History Narrative   Lives with husband   Social Determinants of Health  Financial Resource Strain:   . Difficulty of Paying Living Expenses:   Food Insecurity:   . Worried About Charity fundraiser in the Last Year:   . Arboriculturist in the Last Year:   Transportation Needs:   . Film/video editor (Medical):   Marland Kitchen Lack of Transportation (Non-Medical):   Physical Activity:   . Days of Exercise per Week:   . Minutes of Exercise per Session:   Stress:   . Feeling of Stress :   Social Connections:   . Frequency of Communication with Friends and Family:   . Frequency of Social Gatherings with Friends and Family:   . Attends Religious Services:   . Active Member of Clubs or Organizations:   . Attends Archivist Meetings:   Marland Kitchen Marital Status:   Intimate Partner Violence:   . Fear of Current or Ex-Partner:   . Emotionally Abused:   Marland Kitchen Physically  Abused:   . Sexually Abused:    Family History  Problem Relation Age of Onset  . Diabetes Mother   . Hypertension Brother   . Diabetes type II Brother   . Heart failure Maternal Grandmother        Died age 60  . Hypertension Brother   . Diabetes type II Brother   . Colon cancer Neg Hx     Objective: Office vital signs reviewed. BP 133/88   Pulse 96   Temp 97.8 F (36.6 C)   Wt 185 lb (83.9 kg)   SpO2 96%   BMI 36.13 kg/m   Physical Examination:  General: Awake, alert, well nourished, overweight, No acute distress HEENT: Normal, sclera white, MMM Cardio: regular rate and rhythm, S1S2 heard, no murmurs appreciated Pulm: clear to auscultation bilaterally, no wheezes, rhonchi or rales; normal work of breathing on room air GI: soft, non-tender, non-distended, bowel sounds present x4, no hepatomegaly, no splenomegaly, no masses Extremities: warm, well perfused, No edema, cyanosis or clubbing; +2 pulses bilaterally MSK: normal gait and pronation of bilateral ankles noted with standing position.  Longitudinal arches are preserved.  Ankle: No gross tenderness palpation to the medial ankle.  No palpable deformities.  No edema or erythema.  Knees: She has full active range of motion.  No gross joint swelling, joint effusions, erythema. Skin: dry; intact; no rashes or lesions Neuro: light touch sensation grossly in tact.  Assessment/ Plan: 59 y.o. female   1. Pure hypercholesterolemia Check fasting lipid panel - CMP14+EGFR - Lipid Panel - rosuvastatin (CRESTOR) 10 MG tablet; Take 1 tablet (10 mg total) by mouth daily.  Dispense: 90 tablet; Refill: 3 - TSH  2. Vitamin D deficiency - VITAMIN D 25 Hydroxy (Vit-D Deficiency, Fractures)  3. Chronic pain of both ankles I suspect secondary to supination of the ankles.  She has corrective shoes and I have given her a work note such that she can use these.  I instructed her to follow-up with me if symptoms do not improve with  corrective shoes and we can consider referral to Dr. Micheline Chapman in [place] for further evaluation  4. Chronic pain of both knees Again secondary to above  5. Cough I suspect uncontrolled GERD causing.  Pulmonary exam is totally normal.  Trial of PPI.  She will follow-up in 4 weeks, sooner if needed - pantoprazole (PROTONIX) 40 MG tablet; Take 1 tablet (40 mg total) by mouth daily. As directed  Dispense: 30 tablet; Refill: 3  6. Seasonal allergic rhinitis due to pollen - fluticasone (  FLONASE) 50 MCG/ACT nasal spray; Place 2 sprays into both nostrils daily.  Dispense: 16 g; Refill: 6   No orders of the defined types were placed in this encounter.  No orders of the defined types were placed in this encounter.    Janora Norlander, DO [place] [phone]

## 2020-01-15 LAB — VITAMIN D 25 HYDROXY (VIT D DEFICIENCY, FRACTURES): Vit D, 25-Hydroxy: 52.1 ng/mL (ref 30.0–100.0)

## 2020-01-15 LAB — CMP14+EGFR
ALT: 12 IU/L (ref 0–32)
AST: 16 IU/L (ref 0–40)
Albumin/Globulin Ratio: 1.9 (ref 1.2–2.2)
Albumin: 4.4 g/dL (ref 3.8–4.9)
Alkaline Phosphatase: 100 IU/L (ref 39–117)
BUN/Creatinine Ratio: 16 (ref 9–23)
BUN: 13 mg/dL (ref 6–24)
Bilirubin Total: 0.3 mg/dL (ref 0.0–1.2)
CO2: 21 mmol/L (ref 20–29)
Calcium: 9.3 mg/dL (ref 8.7–10.2)
Chloride: 107 mmol/L — ABNORMAL HIGH (ref 96–106)
Creatinine, Ser: 0.8 mg/dL (ref 0.57–1.00)
GFR calc Af Amer: 95 mL/min/{1.73_m2} (ref >59)
GFR calc non Af Amer: 82 mL/min/{1.73_m2} (ref >59)
Globulin, Total: 2.3 g/dL (ref 1.5–4.5)
Glucose: 113 mg/dL — ABNORMAL HIGH (ref 65–99)
Potassium: 4.3 mmol/L (ref 3.5–5.2)
Sodium: 142 mmol/L (ref 134–144)
Total Protein: 6.7 g/dL (ref 6.0–8.5)

## 2020-01-15 LAB — LIPID PANEL
Chol/HDL Ratio: 2.8 ratio (ref 0.0–4.4)
Cholesterol, Total: 142 mg/dL (ref 100–199)
HDL: 50 mg/dL (ref >39)
LDL Chol Calc (NIH): 68 mg/dL (ref 0–99)
Triglycerides: 141 mg/dL (ref 0–149)
VLDL Cholesterol Cal: 24 mg/dL (ref 5–40)

## 2020-01-15 LAB — TSH: TSH: 1.18 u[IU]/mL (ref 0.450–4.500)

## 2020-01-27 DIAGNOSIS — Z1231 Encounter for screening mammogram for malignant neoplasm of breast: Secondary | ICD-10-CM | POA: Diagnosis not present

## 2020-01-27 DIAGNOSIS — Z01419 Encounter for gynecological examination (general) (routine) without abnormal findings: Secondary | ICD-10-CM | POA: Diagnosis not present

## 2020-01-27 DIAGNOSIS — Z6835 Body mass index (BMI) 35.0-35.9, adult: Secondary | ICD-10-CM | POA: Diagnosis not present

## 2020-02-14 ENCOUNTER — Ambulatory Visit: Payer: BLUE CROSS/BLUE SHIELD | Admitting: Family Medicine

## 2020-02-14 ENCOUNTER — Encounter: Payer: Self-pay | Admitting: Family Medicine

## 2020-02-16 DIAGNOSIS — N951 Menopausal and female climacteric states: Secondary | ICD-10-CM | POA: Diagnosis not present

## 2020-02-16 DIAGNOSIS — R87610 Atypical squamous cells of undetermined significance on cytologic smear of cervix (ASC-US): Secondary | ICD-10-CM | POA: Diagnosis not present

## 2020-05-08 DIAGNOSIS — H25813 Combined forms of age-related cataract, bilateral: Secondary | ICD-10-CM | POA: Diagnosis not present

## 2020-07-31 ENCOUNTER — Ambulatory Visit (INDEPENDENT_AMBULATORY_CARE_PROVIDER_SITE_OTHER): Payer: BLUE CROSS/BLUE SHIELD | Admitting: Nurse Practitioner

## 2020-07-31 ENCOUNTER — Encounter: Payer: Self-pay | Admitting: Nurse Practitioner

## 2020-07-31 VITALS — Temp 100.5°F

## 2020-07-31 DIAGNOSIS — R059 Cough, unspecified: Secondary | ICD-10-CM

## 2020-07-31 DIAGNOSIS — J029 Acute pharyngitis, unspecified: Secondary | ICD-10-CM

## 2020-07-31 LAB — CULTURE, GROUP A STREP

## 2020-07-31 LAB — RAPID STREP SCREEN (MED CTR MEBANE ONLY): Strep Gp A Ag, IA W/Reflex: NEGATIVE

## 2020-07-31 MED ORDER — ACETAMINOPHEN 500 MG PO TABS: 500.0000 mg | ORAL_TABLET | Freq: Four times a day (QID) | ORAL | 0 refills | Status: DC | PRN

## 2020-07-31 MED ORDER — DM-GUAIFENESIN ER 30-600 MG PO TB12: 1.0000 | ORAL_TABLET | Freq: Two times a day (BID) | ORAL | 1 refills | Status: DC

## 2020-07-31 MED ORDER — AZITHROMYCIN 250 MG PO TABS: ORAL_TABLET | ORAL | 0 refills | Status: DC

## 2020-07-31 NOTE — Addendum Note (Signed)
Addended by: Nigel Berthold C on: 07/31/2020 05:30 PM   Modules accepted: Orders

## 2020-07-31 NOTE — Addendum Note (Signed)
Addended by: Thana Ates on: 07/31/2020 04:44 PM   Modules accepted: Orders

## 2020-07-31 NOTE — Progress Notes (Signed)
    Virtual Visit via telephone Note Due to COVID-19 pandemic this visit was conducted virtually. This visit type was conducted due to national recommendations for restrictions regarding the COVID-19 Pandemic (e.g. social distancing, sheltering in place) in an effort to limit this patient's exposure and mitigate transmission in our community. All issues noted in this document were discussed and addressed.  A physical exam was not performed with this format.  I connected with Rachel Larson on 07/31/20 at 03: 13 pm  by telephone and verified that I am speaking with the correct person using two identifiers. Rachel Larson is currently located at home and no one is currently with patient during visit. The provider, Ivy Lynn, NP is located in their office at time of visit.  I discussed the limitations, risks, security and privacy concerns of performing an evaluation and management service by telephone and the availability of in person appointments. I also discussed with the patient that there may be a patient responsible charge related to this service. The patient expressed understanding and agreed to proceed.   History and Present Illness:  Cough This is a new problem. The current episode started in the past 7 days. The problem has been gradually worsening. The problem occurs constantly. The cough is non-productive. Associated symptoms include a fever, headaches and a sore throat. Pertinent negatives include no rash. Nothing aggravates the symptoms. She has tried nothing for the symptoms. The treatment provided no relief.      Review of Systems  Constitutional: Positive for fever and malaise/fatigue.  HENT: Positive for sore throat.   Respiratory: Positive for cough.   Gastrointestinal: Positive for vomiting. Negative for nausea.  Skin: Negative for rash.  Neurological: Positive for weakness and headaches.     Observations/Objective: Televisit  Assessment and  Plan: Cough Patient is reporting cough, with congestion, low-grade fever of 99.1-100.5 in the last few days.  Patient is reporting headache, sore throat, body ache, fatigue and nausea. Advised patient to come by clinic for Covid-19 test. Advised patient to stay hydrated, use Tylenol for aches and pain, started patient on Mucinex for cough and congestion.  And Z-Pak. Provided education to patient, advised patient to stay at home until test results come back.  Patient is advised to go to the emergency department after hours with shortness of breath and unresolved fever. Rx sent to pharmacy.   Follow Up Instructions: With worsening or unresolved symptoms   I discussed the assessment and treatment plan with the patient. The patient was provided an opportunity to ask questions and all were answered. The patient agreed with the plan and demonstrated an understanding of the instructions.   The patient was advised to call back or seek an in-person evaluation if the symptoms worsen or if the condition fails to improve as anticipated.  The above assessment and management plan was discussed with the patient. The patient verbalized understanding of and has agreed to the management plan. Patient is aware to call the clinic if symptoms persist or worsen. Patient is aware when to return to the clinic for a follow-up visit. Patient educated on when it is appropriate to go to the emergency department.   Time call ended:  3:26 pm   I provided 10 minutes of non-face-to-face time during this encounter.    Ivy Lynn, NP

## 2020-07-31 NOTE — Addendum Note (Signed)
Addended by: Rolena Infante on: 07/31/2020 04:07 PM   Modules accepted: Orders

## 2020-07-31 NOTE — Assessment & Plan Note (Signed)
Patient is reporting cough, with congestion, low-grade fever of 99.1-100.5 in the last few days.  Patient is reporting headache, sore throat, body ache, fatigue and nausea. Advised patient to come by clinic for Covid-19 test. Advised patient to stay hydrated, use Tylenol for aches and pain, started patient on Mucinex for cough and congestion.  And Z-Pak. Provided education to patient, advised patient to stay at home until test results come back.  Patient is advised to go to the emergency department after hours with shortness of breath and unresolved fever. Rx sent to pharmacy.

## 2020-08-01 LAB — SARS-COV-2, NAA 2 DAY TAT

## 2020-08-01 LAB — NOVEL CORONAVIRUS, NAA: SARS-CoV-2, NAA: DETECTED — AB

## 2020-08-02 ENCOUNTER — Other Ambulatory Visit: Payer: Self-pay | Admitting: Physician Assistant

## 2020-08-02 DIAGNOSIS — U071 COVID-19: Secondary | ICD-10-CM

## 2020-08-02 DIAGNOSIS — G4733 Obstructive sleep apnea (adult) (pediatric): Secondary | ICD-10-CM

## 2020-08-02 DIAGNOSIS — I6523 Occlusion and stenosis of bilateral carotid arteries: Secondary | ICD-10-CM

## 2020-08-02 DIAGNOSIS — Z6835 Body mass index (BMI) 35.0-35.9, adult: Secondary | ICD-10-CM

## 2020-08-02 LAB — CULTURE, GROUP A STREP: Strep A Culture: NEGATIVE

## 2020-08-02 NOTE — Progress Notes (Signed)
I connected by phone with Rachel Larson on 08/02/2020 at 11:08 AM to discuss the potential use of a new treatment for mild to moderate COVID-19 viral infection in non-hospitalized patients.  This patient is a 59 y.o. female that meets the FDA criteria for Emergency Use Authorization of COVID monoclonal antibody casirivimab/imdevimab or bamlanivimab/eteseviamb.  Has a (+) direct SARS-CoV-2 viral test result  Has mild or moderate COVID-19   Is NOT hospitalized due to COVID-19  Is within 10 days of symptom onset  Has at least one of the high risk factor(s) for progression to severe COVID-19 and/or hospitalization as defined in EUA.  Specific high risk criteria : BMI > 25 and Cardiovascular disease or hypertension   I have spoken and communicated the following to the patient or parent/caregiver regarding COVID monoclonal antibody treatment:  1. FDA has authorized the emergency use for the treatment of mild to moderate COVID-19 in adults and pediatric patients with positive results of direct SARS-CoV-2 viral testing who are 29 years of age and older weighing at least 40 kg, and who are at high risk for progressing to severe COVID-19 and/or hospitalization.  2. The significant known and potential risks and benefits of COVID monoclonal antibody, and the extent to which such potential risks and benefits are unknown.  3. Information on available alternative treatments and the risks and benefits of those alternatives, including clinical trials.  4. Patients treated with COVID monoclonal antibody should continue to self-isolate and use infection control measures (e.g., wear mask, isolate, social distance, avoid sharing personal items, clean and disinfect high touch surfaces, and frequent handwashing) according to CDC guidelines.   5. The patient or parent/caregiver has the option to accept or refuse COVID monoclonal antibody treatment.  After reviewing this information with the patient, the  patient has agreed to receive one of the available covid 19 monoclonal antibodies and will be provided an appropriate fact sheet prior to infusion. [place], [place] 08/02/2020 11:08 AM

## 2020-08-03 ENCOUNTER — Ambulatory Visit (HOSPITAL_COMMUNITY)
Admission: RE | Admit: 2020-08-03 | Payer: BLUE CROSS/BLUE SHIELD | Source: Ambulatory Visit | Attending: Cardiology | Admitting: Cardiology

## 2020-08-03 ENCOUNTER — Ambulatory Visit (HOSPITAL_COMMUNITY)
Admission: RE | Admit: 2020-08-03 | Discharge: 2020-08-03 | Disposition: A | Discharge status: Home / Self Care | Payer: BLUE CROSS/BLUE SHIELD | Source: Ambulatory Visit | Attending: Pulmonary Disease | Admitting: Pulmonary Disease

## 2020-08-03 DIAGNOSIS — U071 COVID-19: Secondary | ICD-10-CM | POA: Diagnosis not present

## 2020-08-03 MED ORDER — FAMOTIDINE IN NACL 20-0.9 MG/50ML-% IV SOLN: 20.0000 mg | Freq: Once | INTRAVENOUS | Status: DC | PRN

## 2020-08-03 MED ORDER — DIPHENHYDRAMINE HCL 50 MG/ML IJ SOLN: 50.0000 mg | Freq: Once | INTRAMUSCULAR | Status: DC | PRN

## 2020-08-03 MED ORDER — SODIUM CHLORIDE 0.9 % IV SOLN: Freq: Once | INTRAVENOUS | Status: AC

## 2020-08-03 MED ORDER — SODIUM CHLORIDE 0.9 % IV SOLN: INTRAVENOUS | Status: DC | PRN

## 2020-08-03 MED ORDER — EPINEPHRINE 0.3 MG/0.3ML IJ SOAJ: 0.3000 mg | Freq: Once | INTRAMUSCULAR | Status: DC | PRN

## 2020-08-03 MED ORDER — METHYLPREDNISOLONE SODIUM SUCC 125 MG IJ SOLR: 125.0000 mg | Freq: Once | INTRAMUSCULAR | Status: DC | PRN

## 2020-08-03 MED ORDER — ALBUTEROL SULFATE HFA 108 (90 BASE) MCG/ACT IN AERS: 2.0000 | INHALATION_SPRAY | Freq: Once | RESPIRATORY_TRACT | Status: DC | PRN

## 2020-08-03 NOTE — Progress Notes (Signed)
  Diagnosis: COVID-19  Physician: Dr. Joya Gaskins  Procedure: Covid Infusion Clinic Med: bamlanivimab\etesevimab infusion - Provided patient with bamlanimivab\etesevimab fact sheet for patients, parents and caregivers prior to infusion.  Complications: No immediate complications noted.  Discharge: Discharged home   Tia Masker 08/03/2020

## 2020-08-03 NOTE — Discharge Instructions (Signed)

## 2020-08-04 ENCOUNTER — Telehealth: Payer: Self-pay | Admitting: Pulmonary Disease

## 2020-08-04 DIAGNOSIS — J101 Influenza due to other identified influenza virus with other respiratory manifestations: Secondary | ICD-10-CM

## 2020-08-04 MED ORDER — ALBUTEROL SULFATE HFA 108 (90 BASE) MCG/ACT IN AERS: 2.0000 | INHALATION_SPRAY | Freq: Four times a day (QID) | RESPIRATORY_TRACT | 0 refills | Status: DC | PRN

## 2020-08-04 NOTE — Telephone Encounter (Signed)
08/04/2020  Yes I am okay with patient having a refill of albuterol.  Okay to send in to pharmacy with 0 refills  Patient should be referred to the monoclonal antibody infusion for treatment since she is tested positive for Covid.  Please send a secure email via [place] email to: [email or web address].  Please include patient's name, date of birth and MRN and personal contact information for the patient.  Patient also needs to be scheduled for a follow-up with Dr. Jenetta Downer as she was last seen in July/2020.  It was requested at that time that she have a 47-month follow-up.  This was never completed.  Additional refills of albuterol as well as other pulmonary needs can be addressed after she completes a follow-up appointment with Dr. Nat Math, FNP

## 2020-08-04 NOTE — Telephone Encounter (Signed)
Spoke to patient, who stated that she was dx with covid on 08/01/2020. She stated that she had a old Rx of ventolin that she took last night and it seems to help her breathing some. She is requesting a new Rx of albuterol.  C/o  of dry cough, chest congestion, increased sob, temp of 101.3 and chill. Sx started on 07/29/2020. Preferred pharmacy is [facility] in [place], please advise if okay to send in Albuterol.

## 2020-08-04 NOTE — Telephone Encounter (Signed)
Spoke with the pt  She has already scheduled appt with Dr Jenetta Downer for 08/24/20  She will keep appt for refills  Has already had the infusion- done 08/03/20  Rx for refill on albuterol faxed to [facility]

## 2020-08-09 ENCOUNTER — Telehealth: Payer: Self-pay

## 2020-08-09 NOTE — Telephone Encounter (Signed)
Please advise when patient needs to be tested again

## 2020-08-09 NOTE — Telephone Encounter (Signed)
I would not utilize a negative Covid test as the indicator to go back to work.  This is not indicated by CDC guidelines.  Patients can test positive for Covid quite past the time that they have total resolution of symptoms.  Would utilize improvement in symptoms, absence of fever (without medication) and appropriate time out of work (10 days from symptom onset w/ 3 consecutive days of improvement in symptoms) as the indications to return to work.

## 2020-08-09 NOTE — Telephone Encounter (Signed)
lmtcb

## 2020-08-21 NOTE — Progress Notes (Signed)
Cardiology Office Note   Date:  08/23/2020   ID:  Rachel Larson, DOB [birth year], MRN [id number]  PCP:  Janora Norlander, DO  Cardiologist:   Minus Breeding, MD   Chief Complaint  Patient presents with  . Shortness of Breath      History of Present Illness: Rachel Larson is a 59 y.o. female who presents for evaluation of OSA recently started on CPAP, former smoker, and hyperlipidemia.   I have seen her for chest pain. POET (Plain Old Exercise Treadmill) in 2018 did not reveal evidence of ischemia.   She was seen again in July with chest pain.   She had a coronary CT and had no CAD or calcium.     Since I last saw her she does have some dyspnea on exertion such as climbing stairs.  She is not describing any new chest discomfort, neck or arm discomfort.  She is not having any PND or orthopnea.  No palpitations, presyncope or syncope.   Past Medical History:  Diagnosis Date  . Baker cyst    rt knee  . Chest pain   . Hyperlipidemia   . Reflux     Past Surgical History:  Procedure Laterality Date  . CHOLECYSTECTOMY    . NASAL SINUS SURGERY       Current Outpatient Medications  Medication Sig Dispense Refill  . acetaminophen (TYLENOL) 500 MG tablet Take 1 tablet (500 mg total) by mouth every 6 (six) hours as needed. 30 tablet 0  . albuterol (VENTOLIN HFA) 108 (90 Base) MCG/ACT inhaler Inhale 2 puffs into the lungs every 6 (six) hours as needed for wheezing or shortness of breath. 8 g 0  . fluticasone (FLONASE) 50 MCG/ACT nasal spray Place 2 sprays into both nostrils daily. 16 g 6  . pantoprazole (PROTONIX) 40 MG tablet Take 1 tablet (40 mg total) by mouth daily. As directed 30 tablet 3  . rosuvastatin (CRESTOR) 10 MG tablet Take 1 tablet (10 mg total) by mouth daily. 90 tablet 3  . VITAMIN D, CHOLECALCIFEROL, PO Take 1 tablet by mouth 2 (two) times a week.      No current facility-administered medications for this visit.    Allergies:   Sulfonamide derivatives      ROS:  Please see the history of present illness.   Otherwise, review of systems are positive for none.    All other systems are reviewed and negative.    PHYSICAL EXAM: VS:  BP 122/82 (BP Location: Left Arm, Patient Position: Sitting)   Pulse (!) 59   Ht 5' (1.524 m)   Wt 184 lb 12.8 oz (83.8 kg)   SpO2 99%   BMI 36.09 kg/m  , BMI Body mass index is 36.09 kg/m. GENERAL:  Well appearing NECK:  No jugular venous distention, waveform within normal limits, carotid upstroke brisk and symmetric, no bruits, no thyromegaly LUNGS:  Clear to auscultation bilaterally CHEST:  Unremarkable HEART:  PMI not displaced or sustained,S1 and S2 within normal limits, no S3, no S4, no clicks, no rubs, no murmurs ABD:  Flat, positive bowel sounds normal in frequency in pitch, no bruits, no rebound, no guarding, no midline pulsatile mass, no hepatomegaly, no splenomegaly EXT:  2 plus pulses throughout, no edema, no cyanosis no clubbing   EKG:  EKG is ordered today. Normal sinus rhythm, rate 59, axis within normal limits, nonspecific inferior Q waves, poor anterior R wave progression, no acute ST-T wave changes.   Recent Labs:  01/14/2020: ALT 12; BUN 13; Creatinine, Ser 0.80; Potassium 4.3; Sodium 142; TSH 1.180    Lipid Panel    Component Value Date/Time   CHOL 142 01/14/2020 0855   TRIG 141 01/14/2020 0855   TRIG 133 09/09/2016 0932   HDL 50 01/14/2020 0855   HDL 54 09/09/2016 0932   CHOLHDL 2.8 01/14/2020 0855   LDLCALC 68 01/14/2020 0855   LDLCALC 92 07/29/2014 1057      Wt Readings from Last 3 Encounters:  08/23/20 184 lb 12.8 oz (83.8 kg)  01/14/20 185 lb (83.9 kg)  07/07/19 188 lb (85.3 kg)      Other studies Reviewed: Additional studies/ records that were reviewed today include:  Labs Review of the above records demonstrates:  Please see elsewhere in the note.     ASSESSMENT AND PLAN:  DOE I reviewed pulmonary function test from 2019.  These were normal.  She has no  evidence of coronary disease.  I think this is probably related to weight and deconditioning and we talked about weight loss and exercise.  If after a regimen of this she does not improve with her breathing we could reconsider other causes.   Hyperlipidemia LDL was 68 with an HDL of 50.    She does not have an indication for statin but I will defer to Janora Norlander, DO   OSA  She is not using her CPAP.  I suggested she rediscuss this with the prescribing physician.    CAROTID: She had a carotid Doppler today.  Final results are pending preliminary did not suggest blockage we will follow-up with her.    Current medicines are reviewed at length with the patient today.  The patient does not have concerns regarding medicines.  The following changes have been made:   None  Labs/ tests ordered today include: none  Orders Placed This Encounter  Procedures  . EKG 12-Lead     Disposition:   FU with me as needed.    Signed, Minus Breeding, MD  08/23/2020 10:00 AM    [place]

## 2020-08-23 ENCOUNTER — Ambulatory Visit (INDEPENDENT_AMBULATORY_CARE_PROVIDER_SITE_OTHER): Payer: BLUE CROSS/BLUE SHIELD | Admitting: Cardiology

## 2020-08-23 ENCOUNTER — Ambulatory Visit (HOSPITAL_COMMUNITY)
Admission: RE | Admit: 2020-08-23 | Discharge: 2020-08-23 | Disposition: A | Discharge status: Home / Self Care | Payer: BLUE CROSS/BLUE SHIELD | Source: Ambulatory Visit | Attending: Cardiology | Admitting: Cardiology

## 2020-08-23 ENCOUNTER — Encounter: Payer: Self-pay | Admitting: Cardiology

## 2020-08-23 ENCOUNTER — Other Ambulatory Visit: Payer: Self-pay

## 2020-08-23 VITALS — BP 122/82 | HR 59 | Ht 60.0 in | Wt 184.8 lb

## 2020-08-23 DIAGNOSIS — R0602 Shortness of breath: Secondary | ICD-10-CM | POA: Diagnosis not present

## 2020-08-23 DIAGNOSIS — I6523 Occlusion and stenosis of bilateral carotid arteries: Secondary | ICD-10-CM | POA: Insufficient documentation

## 2020-08-23 DIAGNOSIS — E785 Hyperlipidemia, unspecified: Secondary | ICD-10-CM | POA: Diagnosis not present

## 2020-08-23 DIAGNOSIS — R072 Precordial pain: Secondary | ICD-10-CM

## 2020-08-23 NOTE — Patient Instructions (Signed)
Medication Instructions:  No changes  *If you need a refill on your cardiac medications before your next appointment, please call your pharmacy*   Lab Work: Not needed If you have labs (blood work) drawn today and your tests are completely normal, you will receive your results only by: Marland Kitchen MyChart Message (if you have MyChart) OR . A paper copy in the mail If you have any lab test that is abnormal or we need to change your treatment, we will call you to review the results.   Testing/Procedures: Not needed   Follow-Up: At [facility], you and your health needs are our priority.  As part of our continuing mission to provide you with exceptional heart care, we have created designated Provider Care Teams.  These Care Teams include your primary Cardiologist (physician) and Advanced Practice Providers (APPs -  Physician Assistants and Nurse Practitioners) who all work together to provide you with the care you need, when you need it.     Your next appointment:    as needed  The format for your next appointment:   In Person  Provider:   Minus Breeding, MD

## 2020-08-24 ENCOUNTER — Ambulatory Visit: Payer: BLUE CROSS/BLUE SHIELD | Admitting: Pulmonary Disease

## 2020-08-24 ENCOUNTER — Encounter: Payer: Self-pay | Admitting: Pulmonary Disease

## 2020-08-24 VITALS — BP 118/62 | HR 75 | Temp 97.6°F | Ht 60.0 in | Wt 187.0 lb

## 2020-08-24 DIAGNOSIS — Z9989 Dependence on other enabling machines and devices: Secondary | ICD-10-CM | POA: Diagnosis not present

## 2020-08-24 DIAGNOSIS — G4733 Obstructive sleep apnea (adult) (pediatric): Secondary | ICD-10-CM | POA: Diagnosis not present

## 2020-08-24 DIAGNOSIS — R5383 Other fatigue: Secondary | ICD-10-CM

## 2020-08-24 NOTE — Progress Notes (Signed)
Rachel Larson    [id number]    1961/02/14  Primary Care Physician:Gottschalk, Koleen Distance, DO  Referring Physician: Follows up with Dr. Laurance Flatten Chief complaint:   Shortness of breath  Patient recently tested positive for Covid  HPI:  Tested positive for Covid on 08/01/2020 Received monoclonal antibody  Still has significant fatigue  Was been followed for obstructive sleep apnea Has not been able to use her CPAP since recent illness  Diagnosed with moderate obstructive sleep apnea, started CPAP but has not been able to get used to it before getting sick  She does have occasional chest discomfort Heavy breathing  Quit smoking many years back in 1999 She still does have significant shortness of breath with activity  PFT from 2019 was within normal limits Chest x-ray from 2019 within normal limits  Quit smoking many years back in 1999 History of reflux Sore throats History of significant snoring Nonrestorative sleep  Occupation: No pertinent predisposition Smoking history: Reformed smoker  Outpatient Encounter Medications as of 08/24/2020  Medication Sig  . acetaminophen (TYLENOL) 500 MG tablet Take 1 tablet (500 mg total) by mouth every 6 (six) hours as needed.  Marland Kitchen albuterol (VENTOLIN HFA) 108 (90 Base) MCG/ACT inhaler Inhale 2 puffs into the lungs every 6 (six) hours as needed for wheezing or shortness of breath.  . fluticasone (FLONASE) 50 MCG/ACT nasal spray Place 2 sprays into both nostrils daily.  . pantoprazole (PROTONIX) 40 MG tablet Take 1 tablet (40 mg total) by mouth daily. As directed  . rosuvastatin (CRESTOR) 10 MG tablet Take 1 tablet (10 mg total) by mouth daily.  Marland Kitchen VITAMIN D, CHOLECALCIFEROL, PO Take 1 tablet by mouth 2 (two) times a week.    No facility-administered encounter medications on file as of 08/24/2020.    Allergies as of 08/24/2020 - Review Complete 08/24/2020  Allergen Reaction Noted  . Sulfonamide derivatives Hives and Itching  06/26/2011    Past Medical History:  Diagnosis Date  . Baker cyst    rt knee  . Chest pain   . Hyperlipidemia   . Reflux     Past Surgical History:  Procedure Laterality Date  . CHOLECYSTECTOMY    . NASAL SINUS SURGERY      Family History  Problem Relation Age of Onset  . Diabetes Mother   . Hypertension Brother   . Diabetes type II Brother   . Heart failure Maternal Grandmother        Died age 70  . Hypertension Brother   . Diabetes type II Brother   . Colon cancer Neg Hx     Social History   Socioeconomic History  . Marital status: Married    Spouse name: Not on file  . Number of children: 2  . Years of education: Not on file  . Highest education level: Not on file  Occupational History  . Occupation: Press photographer     Comment: Copy  Tobacco Use  . Smoking status: Former Smoker    Packs/day: 1.00    Years: 18.00    Pack years: 18.00    Types: Cigarettes    Start date: 10/24/1977    Quit date: 07/09/1998    Years since quitting: 22.1  . Smokeless tobacco: Never Used  Vaping Use  . Vaping Use: Never used  Substance and Sexual Activity  . Alcohol use: No    Comment: occasionally  . Drug use: No  . Sexual activity: Not on  file  Other Topics Concern  . Not on file  Social History Narrative   Lives with husband   Social Determinants of Health   Financial Resource Strain:   . Difficulty of Paying Living Expenses: Not on file  Food Insecurity:   . Worried About Charity fundraiser in the Last Year: Not on file  . Ran Out of Food in the Last Year: Not on file  Transportation Needs:   . Lack of Transportation (Medical): Not on file  . Lack of Transportation (Non-Medical): Not on file  Physical Activity:   . Days of Exercise per Week: Not on file  . Minutes of Exercise per Session: Not on file  Stress:   . Feeling of Stress : Not on file  Social Connections:   . Frequency of Communication with Friends and Family: Not on file  .  Frequency of Social Gatherings with Friends and Family: Not on file  . Attends Religious Services: Not on file  . Active Member of Clubs or Organizations: Not on file  . Attends Archivist Meetings: Not on file  . Marital Status: Not on file  Intimate Partner Violence:   . Fear of Current or Ex-Partner: Not on file  . Emotionally Abused: Not on file  . Physically Abused: Not on file  . Sexually Abused: Not on file    Review of Systems  Constitutional: Positive for fatigue.  HENT: Negative.   Eyes: Negative.   Respiratory: Positive for shortness of breath. Negative for cough.   Cardiovascular: Negative.  Negative for leg swelling.  Gastrointestinal: Negative.   Endocrine: Negative.     Vitals:   08/24/20 1545  BP: 118/62  Pulse: 75  Temp: 97.6 F (36.4 C)  SpO2: 99%     Physical Exam Constitutional:      Appearance: She is well-developed.  HENT:     Head: Normocephalic and atraumatic.  Eyes:     General:        Right eye: No discharge.        Left eye: No discharge.     Conjunctiva/sclera: Conjunctivae normal.     Pupils: Pupils are equal, round, and reactive to light.  Neck:     Thyroid: No thyromegaly.     Trachea: No tracheal deviation.  Cardiovascular:     Rate and Rhythm: Normal rate and regular rhythm.  Pulmonary:     Effort: Pulmonary effort is normal. No respiratory distress.     Breath sounds: Normal breath sounds. No stridor. No wheezing, rhonchi or rales.  Abdominal:     General: There is no distension.     Palpations: Abdomen is soft.     Tenderness: There is no abdominal tenderness.  Musculoskeletal:     Cervical back: No rigidity or tenderness.  Neurological:     Mental Status: She is alert and oriented to person, place, and time.     Cranial Nerves: No cranial nerve deficit.     Deep Tendon Reflexes: Reflexes are normal and symmetric.    Data Reviewed: Tested positive for Covid 08/01/2020  Assessment:   Post Covid  fatigue  Mild obstructive sleep apnea with moderate oxygen desaturations  Coronary artery disease  Shortness of breath   Plan/Recommendations:  Reassurance  No testing need to be performed at present  Continue using CPAP on a regular basis when she can get back into using it regularly  Regular exercise is encouraged  encouraged to call with any significant concerns  Rachel Larson  Ander Slade MD [facility] 08/24/2020, 4:01 PM  CC: Janora Norlander, DO

## 2020-08-24 NOTE — Patient Instructions (Signed)
Post Covid fatigue  Continue your normal routine I do not feel that the x-ray will help [place] in any way at present  you can always call, If you are having more symptoms that we need to do something about  I will see you back in 6 months

## 2020-08-26 ENCOUNTER — Other Ambulatory Visit: Payer: Self-pay | Admitting: Pulmonary Disease

## 2020-08-26 DIAGNOSIS — J101 Influenza due to other identified influenza virus with other respiratory manifestations: Secondary | ICD-10-CM

## 2020-10-05 ENCOUNTER — Other Ambulatory Visit: Payer: Self-pay | Admitting: Family Medicine

## 2020-10-05 DIAGNOSIS — R059 Cough, unspecified: Secondary | ICD-10-CM

## 2020-11-01 ENCOUNTER — Other Ambulatory Visit: Payer: Self-pay | Admitting: *Deleted

## 2020-11-01 DIAGNOSIS — E7800 Pure hypercholesterolemia, unspecified: Secondary | ICD-10-CM

## 2020-11-01 DIAGNOSIS — R059 Cough, unspecified: Secondary | ICD-10-CM

## 2020-11-01 MED ORDER — PANTOPRAZOLE SODIUM 40 MG PO TBEC: 40.0000 mg | DELAYED_RELEASE_TABLET | Freq: Every day | ORAL | 0 refills | Status: DC

## 2020-11-01 MED ORDER — ROSUVASTATIN CALCIUM 10 MG PO TABS: 10.0000 mg | ORAL_TABLET | Freq: Every day | ORAL | 0 refills | Status: DC

## 2020-11-08 ENCOUNTER — Other Ambulatory Visit: Payer: Self-pay | Admitting: Family Medicine

## 2020-11-08 DIAGNOSIS — R059 Cough, unspecified: Secondary | ICD-10-CM

## 2021-01-16 ENCOUNTER — Encounter: Payer: Self-pay | Admitting: Family Medicine

## 2021-01-16 ENCOUNTER — Other Ambulatory Visit: Payer: Self-pay

## 2021-01-16 ENCOUNTER — Ambulatory Visit: Payer: BLUE CROSS/BLUE SHIELD | Admitting: Family Medicine

## 2021-01-16 VITALS — BP 107/79 | HR 84 | Temp 98.1°F | Ht 60.0 in | Wt 182.2 lb

## 2021-01-16 DIAGNOSIS — R739 Hyperglycemia, unspecified: Secondary | ICD-10-CM | POA: Diagnosis not present

## 2021-01-16 DIAGNOSIS — Z0001 Encounter for general adult medical examination with abnormal findings: Secondary | ICD-10-CM

## 2021-01-16 DIAGNOSIS — E7800 Pure hypercholesterolemia, unspecified: Secondary | ICD-10-CM | POA: Diagnosis not present

## 2021-01-16 DIAGNOSIS — J301 Allergic rhinitis due to pollen: Secondary | ICD-10-CM

## 2021-01-16 DIAGNOSIS — Z0000 Encounter for general adult medical examination without abnormal findings: Secondary | ICD-10-CM

## 2021-01-16 LAB — GLUCOSE HEMOCUE WAIVED: Glu Hemocue Waived: 121 mg/dL — ABNORMAL HIGH (ref 65–99)

## 2021-01-16 LAB — BAYER DCA HB A1C WAIVED: HB A1C (BAYER DCA - WAIVED): 5.5 % (ref <7.0)

## 2021-01-16 MED ORDER — FLUTICASONE PROPIONATE 50 MCG/ACT NA SUSP: 2.0000 | Freq: Every day | NASAL | 1 refills | Status: DC

## 2021-01-16 NOTE — Progress Notes (Signed)
Rachel Larson is a 60 y.o. female presents to office today for annual physical exam examination.    Concerns today include: 1.  Elevated blood sugars/obesity Patient reports that she is been seeing blood sugars at home between 120 and 160.  Some of these blood sugars have been fasting.  She reports feeling sweaty and fatigued.  She occasionally has nausea but no vomiting or diarrhea.  She has mild intermittent blurred vision in her right eye sometimes feels like it has a foreign body sensation.  Denies any contact use.  Her last eye exam was about a year ago.  She would be interested in pursuing obesity medicine.  2.  Allergic rhinitis Patient reports that she has responded well to Flonase in the past for allergies.  Sometimes she will have nasal symptoms and sometimes she will have ear symptoms.  3.  Hyperlipidemia Patient is compliant with Crestor 10 mg daily.  No reports of chest pain, shortness of breath.  Diet: Currently trying to change eating habits, Exercise: Currently changing habits Last eye exam: Up-to-date Last dental exam: Up-to-date Last colonoscopy: Up-to-date Last mammogram: Per OB/GYN, Dr. Nori Riis Last pap smear: Per OB/GYN, Dr. Nori Riis.  Scheduled 03/14/2021 Refills needed today: Flonase Immunizations needed:  Immunization History  Administered Date(s) Administered  . Hepatitis B, adult 07/01/2014, 08/04/2014, 01/02/2015  . Influenza,inj,Quad PF,6+ Mos 07/22/2013, 08/02/2014, 07/02/2019  . MMR 11/14/2006  . PPD Test 01/31/2014, 07/02/2019  . Pneumococcal Conjugate-13 11/26/2018  . Td 11/14/2006, 04/18/2010  . Tdap 11/14/2006, 04/18/2010     Past Medical History:  Diagnosis Date  . Baker cyst    rt knee  . Chest pain   . Hyperlipidemia   . Reflux    Social History   Socioeconomic History  . Marital status: Married    Spouse name: Not on file  . Number of children: 2  . Years of education: Not on file  . Highest education level: Not on file  Occupational  History  . Occupation: Press photographer     Comment: Copy  Tobacco Use  . Smoking status: Former Smoker    Packs/day: 1.00    Years: 18.00    Pack years: 18.00    Types: Cigarettes    Start date: 10/24/1977    Quit date: 07/09/1998    Years since quitting: 22.5  . Smokeless tobacco: Never Used  Vaping Use  . Vaping Use: Never used  Substance and Sexual Activity  . Alcohol use: No    Comment: occasionally  . Drug use: No  . Sexual activity: Not on file  Other Topics Concern  . Not on file  Social History Narrative   Lives with husband   Social Determinants of Health   Financial Resource Strain: Not on file  Food Insecurity: Not on file  Transportation Needs: Not on file  Physical Activity: Not on file  Stress: Not on file  Social Connections: Not on file  Intimate Partner Violence: Not on file   Past Surgical History:  Procedure Laterality Date  . CHOLECYSTECTOMY    . NASAL SINUS SURGERY     Family History  Problem Relation Age of Onset  . Diabetes Mother   . Hypertension Brother   . Diabetes type II Brother   . Heart failure Maternal Grandmother        Died age 54  . Hypertension Brother   . Diabetes type II Brother   . Colon cancer Neg Hx     Current Outpatient Medications:  .  albuterol (VENTOLIN HFA) 108 (90 Base) MCG/ACT inhaler, INHALE 2 PUFFS INTO THE LUNGS EVERY 6 (SIX) HOURS AS NEEDED FOR WHEEZING OR SHORTNESS OF BREATH, Disp: 8.5 each, Rfl: 2 .  pantoprazole (PROTONIX) 40 MG tablet, Take 1 tablet (40 mg total) by mouth daily., Disp: 90 tablet, Rfl: 0 .  rosuvastatin (CRESTOR) 10 MG tablet, Take 1 tablet (10 mg total) by mouth daily., Disp: 90 tablet, Rfl: 0 .  VITAMIN D, CHOLECALCIFEROL, PO, Take 1 tablet by mouth 2 (two) times a week. , Disp: , Rfl:  .  fluticasone (FLONASE) 50 MCG/ACT nasal spray, Place 2 sprays into both nostrils daily., Disp: 48 g, Rfl: 1  Allergies  Allergen Reactions  . Sulfonamide Derivatives Hives and Itching      ROS: Review of Systems Pertinent items noted in HPI and remainder of comprehensive ROS otherwise negative.    Physical exam BP 107/79   Pulse 84   Temp 98.1 F (36.7 C) (Temporal)   Ht 5' (1.524 m)   Wt 182 lb 3.2 oz (82.6 kg)   SpO2 97%   BMI 35.58 kg/m  General appearance: alert, cooperative, appears stated age, no distress and moderately obese Head: Normocephalic, without obvious abnormality, atraumatic Eyes: negative findings: lids and lashes normal, conjunctivae and sclerae normal and corneas clear Ears: normal TM's and external ear canals both ears Nose: Nares normal. Septum midline. Mucosa normal. No drainage or sinus tenderness. Throat: Cobblestone appearance of the oropharynx. Neck: no adenopathy, no carotid bruit, no JVD, supple, symmetrical, trachea midline and thyroid not enlarged, symmetric, no tenderness/mass/nodules Back: symmetric, no curvature. ROM normal. No CVA tenderness. Lungs: clear to auscultation bilaterally Heart: regular rate and rhythm, S1, S2 normal, no murmur, click, rub or gallop Abdomen: soft, non-tender; bowel sounds normal; no masses,  no organomegaly Extremities: extremities normal, atraumatic, no cyanosis or edema Pulses: 2+ and symmetric Skin: Skin color, texture, turgor normal. No rashes or lesions Lymph nodes: Cervical, supraclavicular, and axillary nodes normal. Neurologic: Alert and oriented X 3, normal strength and tone. Normal symmetric reflexes. Normal coordination and gait   Assessment/ Plan: Rachel Larson here for annual physical exam.   Annual physical exam  Pure hypercholesterolemia - Plan: CMP14+EGFR, Lipid Panel  Elevated blood sugar - Plan: Bayer DCA Hb A1c Waived, Glucose Hemocue Waived  Seasonal allergic rhinitis due to pollen - Plan: fluticasone (FLONASE) 50 MCG/ACT nasal spray  Check fasting lipid, CMP.  Continue statin for now.  Plan for renewal of statin pending results  OB/GYN to perform Pap and mammogram in  May for patient.  Asked that these records be sent to our office  Her blood sugar was elevated today but her A1c was normal.  No evidence of prediabetes or diabetes.  I wonder if the monitor that she has at home is in fact defective due to age.  Advised to replace this.  I have renewed her Flonase nasal spray.  Her allergic rhinitis is stable  Counseled on healthy lifestyle choices, including diet (rich in fruits, vegetables and lean meats and lo.w in salt and simple carbohydrates) and exercise (at least 30 minutes of moderate physical activity daily).  Patient to follow up in 1 year for annual exam or sooner if needed.  Brooklen Runquist M. Lajuana Ripple, DO

## 2021-01-16 NOTE — Patient Instructions (Signed)
Starleen Blue are the weight loss medications we talked about. Check with insurance to determine if this is covered  Consider compression hose for the ankle/ leg swelling.  Consider a new meter.  The one you have is not giving you accurate readings.  You had labs performed today.  You will be contacted with the results of the labs once they are available, usually in the next 3 business days for routine lab work.  If you have an active my chart account, they will be released to your MyChart.  If you prefer to have these labs released to you via telephone, please let us know.  If you had a pap smear or biopsy performed, expect to be contacted in about 7-10 days.  Preventive Care 30-21 Years Old, Female Preventive care refers to lifestyle choices and visits with your health care provider that can promote health and wellness. This includes:  A yearly physical exam. This is also called an annual wellness visit.  Regular dental and eye exams.  Immunizations.  Screening for certain conditions.  Healthy lifestyle choices, such as: ? Eating a healthy diet. ? Getting regular exercise. ? Not using drugs or products that contain nicotine and tobacco. ? Limiting alcohol use. What can I expect for my preventive care visit? Physical exam Your health care provider will check your:  Height and weight. These may be used to calculate your BMI (body mass index). BMI is a measurement that tells if you are at a healthy weight.  Heart rate and blood pressure.  Body temperature.  Skin for abnormal spots. Counseling Your health care provider may ask you questions about your:  Past medical problems.  Family's medical history.  Alcohol, tobacco, and drug use.  Emotional well-being.  Home life and relationship well-being.  Sexual activity.  Diet, exercise, and sleep habits.  Work and work Statistician.  Access to firearms.  Method of birth control.  Menstrual cycle.  Pregnancy  history. What immunizations do I need? Vaccines are usually given at various ages, according to a schedule. Your health care provider will recommend vaccines for you based on your age, medical history, and lifestyle or other factors, such as travel or where you work.   What tests do I need? Blood tests  Lipid and cholesterol levels. These may be checked every 5 years, or more often if you are over 51 years old.  Hepatitis C test.  Hepatitis B test. Screening  Lung cancer screening. You may have this screening every year starting at age 24 if you have a 30-pack-year history of smoking and currently smoke or have quit within the past 15 years.  Colorectal cancer screening. ? All adults should have this screening starting at age 82 and continuing until age 57. ? Your health care provider may recommend screening at age 58 if you are at increased risk. ? You will have tests every 1-10 years, depending on your results and the type of screening test.  Diabetes screening. ? This is done by checking your blood sugar (glucose) after you have not eaten for a while (fasting). ? You may have this done every 1-3 years.  Mammogram. ? This may be done every 1-2 years. ? Talk with your health care provider about when you should start having regular mammograms. This may depend on whether you have a family history of breast cancer.  BRCA-related cancer screening. This may be done if you have a family history of breast, ovarian, tubal, or peritoneal cancers.  Pelvic exam  and Pap test. ? This may be done every 3 years starting at age 36. ? Starting at age 74, this may be done every 5 years if you have a Pap test in combination with an HPV test. Other tests  STD (sexually transmitted disease) testing, if you are at risk.  Bone density scan. This is done to screen for osteoporosis. You may have this scan if you are at high risk for osteoporosis. Talk with your health care provider about your test  results, treatment options, and if necessary, the need for more tests. Follow these instructions at home: Eating and drinking  Eat a diet that includes fresh fruits and vegetables, whole grains, lean protein, and low-fat dairy products.  Take vitamin and mineral supplements as recommended by your health care provider.  Do not drink alcohol if: ? Your health care provider tells you not to drink. ? You are pregnant, may be pregnant, or are planning to become pregnant.  If you drink alcohol: ? Limit how much you have to 0-1 drink a day. ? Be aware of how much alcohol is in your drink. In the [place] one drink equals one 12 oz bottle of beer (355 mL), one 5 oz glass of wine (148 mL), or one 1 oz glass of hard liquor (44 mL).   Lifestyle  Take daily care of your teeth and gums. Brush your teeth every morning and night with fluoride toothpaste. Floss one time each day.  Stay active. Exercise for at least 30 minutes 5 or more days each week.  Do not use any products that contain nicotine or tobacco, such as cigarettes, e-cigarettes, and chewing tobacco. If you need help quitting, ask your health care provider.  Do not use drugs.  If you are sexually active, practice safe sex. Use a condom or other form of protection to prevent STIs (sexually transmitted infections).  If you do not wish to become pregnant, use a form of birth control. If you plan to become pregnant, see your health care provider for a prepregnancy visit.  If told by your health care provider, take low-dose aspirin daily starting at age 82.  Find healthy ways to cope with stress, such as: ? Meditation, yoga, or listening to music. ? Journaling. ? Talking to a trusted person. ? Spending time with friends and family. Safety  Always wear your seat belt while driving or riding in a vehicle.  Do not drive: ? If you have been drinking alcohol. Do not ride with someone who has been drinking. ? When you are tired or  distracted. ? While texting.  Wear a helmet and other protective equipment during sports activities.  If you have firearms in your house, make sure you follow all gun safety procedures. What's next?  Visit your health care provider once a year for an annual wellness visit.  Ask your health care provider how often you should have your eyes and teeth checked.  Stay up to date on all vaccines. This information is not intended to replace advice given to you by your health care provider. Make sure you discuss any questions you have with your health care provider. Document Revised: 07/11/2020 Document Reviewed: 06/18/2018 Elsevier Patient Education  2021 [employer].

## 2021-01-17 LAB — CMP14+EGFR
ALT: 17 IU/L (ref 0–32)
AST: 19 IU/L (ref 0–40)
Albumin/Globulin Ratio: 2 (ref 1.2–2.2)
Albumin: 4.9 g/dL (ref 3.8–4.9)
Alkaline Phosphatase: 97 IU/L (ref 44–121)
BUN/Creatinine Ratio: 21 (ref 9–23)
BUN: 18 mg/dL (ref 6–24)
Bilirubin Total: 0.5 mg/dL (ref 0.0–1.2)
CO2: 21 mmol/L (ref 20–29)
Calcium: 9.7 mg/dL (ref 8.7–10.2)
Chloride: 103 mmol/L (ref 96–106)
Creatinine, Ser: 0.86 mg/dL (ref 0.57–1.00)
Globulin, Total: 2.4 g/dL (ref 1.5–4.5)
Glucose: 125 mg/dL — ABNORMAL HIGH (ref 65–99)
Potassium: 4.1 mmol/L (ref 3.5–5.2)
Sodium: 142 mmol/L (ref 134–144)
Total Protein: 7.3 g/dL (ref 6.0–8.5)
eGFR: 78 mL/min/{1.73_m2} (ref >59)

## 2021-01-17 LAB — LIPID PANEL
Chol/HDL Ratio: 3.5 ratio (ref 0.0–4.4)
Cholesterol, Total: 178 mg/dL (ref 100–199)
HDL: 51 mg/dL (ref >39)
LDL Chol Calc (NIH): 95 mg/dL (ref 0–99)
Triglycerides: 190 mg/dL — ABNORMAL HIGH (ref 0–149)
VLDL Cholesterol Cal: 32 mg/dL (ref 5–40)

## 2021-01-18 ENCOUNTER — Other Ambulatory Visit: Payer: Self-pay | Admitting: Family Medicine

## 2021-01-18 DIAGNOSIS — E7800 Pure hypercholesterolemia, unspecified: Secondary | ICD-10-CM

## 2021-01-18 MED ORDER — ROSUVASTATIN CALCIUM 20 MG PO TABS: 20.0000 mg | ORAL_TABLET | Freq: Every day | ORAL | 1 refills | Status: DC

## 2021-02-16 ENCOUNTER — Other Ambulatory Visit: Payer: Self-pay | Admitting: Family Medicine

## 2021-02-16 DIAGNOSIS — E7800 Pure hypercholesterolemia, unspecified: Secondary | ICD-10-CM

## 2021-03-14 DIAGNOSIS — Z1231 Encounter for screening mammogram for malignant neoplasm of breast: Secondary | ICD-10-CM | POA: Diagnosis not present

## 2021-03-14 DIAGNOSIS — Z01419 Encounter for gynecological examination (general) (routine) without abnormal findings: Secondary | ICD-10-CM | POA: Diagnosis not present

## 2021-03-14 DIAGNOSIS — R87610 Atypical squamous cells of undetermined significance on cytologic smear of cervix (ASC-US): Secondary | ICD-10-CM | POA: Diagnosis not present

## 2021-03-14 DIAGNOSIS — Z6835 Body mass index (BMI) 35.0-35.9, adult: Secondary | ICD-10-CM | POA: Diagnosis not present

## 2021-03-15 ENCOUNTER — Other Ambulatory Visit: Payer: Self-pay | Admitting: Family Medicine

## 2021-03-15 DIAGNOSIS — R059 Cough, unspecified: Secondary | ICD-10-CM

## 2021-03-15 DIAGNOSIS — E7800 Pure hypercholesterolemia, unspecified: Secondary | ICD-10-CM

## 2021-03-23 DIAGNOSIS — J329 Chronic sinusitis, unspecified: Secondary | ICD-10-CM | POA: Diagnosis not present

## 2021-03-23 DIAGNOSIS — R110 Nausea: Secondary | ICD-10-CM | POA: Diagnosis not present

## 2021-03-23 DIAGNOSIS — R059 Cough, unspecified: Secondary | ICD-10-CM | POA: Diagnosis not present

## 2021-03-23 DIAGNOSIS — J029 Acute pharyngitis, unspecified: Secondary | ICD-10-CM | POA: Diagnosis not present

## 2021-05-24 ENCOUNTER — Encounter: Payer: Self-pay | Admitting: Family Medicine

## 2021-05-25 NOTE — Telephone Encounter (Signed)
Please make sure she is offered a televisit for antivirals if she has not already done so

## 2021-08-10 ENCOUNTER — Other Ambulatory Visit: Payer: Self-pay | Admitting: Family Medicine

## 2021-08-10 DIAGNOSIS — E7800 Pure hypercholesterolemia, unspecified: Secondary | ICD-10-CM

## 2021-08-10 NOTE — Telephone Encounter (Signed)
OV 01/16/21 rtc 1 yr

## 2021-10-22 ENCOUNTER — Encounter: Payer: Self-pay | Admitting: Family Medicine

## 2021-11-20 ENCOUNTER — Telehealth: Payer: Self-pay | Admitting: Family Medicine

## 2021-11-20 ENCOUNTER — Ambulatory Visit: Payer: No Typology Code available for payment source | Admitting: Family Medicine

## 2021-11-20 ENCOUNTER — Encounter: Payer: Self-pay | Admitting: Family Medicine

## 2021-11-20 DIAGNOSIS — R739 Hyperglycemia, unspecified: Secondary | ICD-10-CM

## 2021-11-20 DIAGNOSIS — Z713 Dietary counseling and surveillance: Secondary | ICD-10-CM

## 2021-11-20 DIAGNOSIS — Z23 Encounter for immunization: Secondary | ICD-10-CM

## 2021-11-20 DIAGNOSIS — E7800 Pure hypercholesterolemia, unspecified: Secondary | ICD-10-CM

## 2021-11-20 LAB — BAYER DCA HB A1C WAIVED: HB A1C (BAYER DCA - WAIVED): 5.2 % (ref 4.8–5.6)

## 2021-11-20 MED ORDER — INSULIN PEN NEEDLE 32G X 6 MM MISC: 3 refills | Status: DC

## 2021-11-20 MED ORDER — SAXENDA 18 MG/3ML ~~LOC~~ SOPN: 3.0000 mg | PEN_INJECTOR | Freq: Every day | SUBCUTANEOUS | 3 refills | Status: DC

## 2021-11-20 NOTE — Progress Notes (Signed)
Subjective: CC: Initial weight management appointment HPI: Rachel Larson is a 61 y.o. female that presents today for initial weight loss visit.  Patient reports that weight loss is important to them because her son's wedding is coming up and she wants to feel more comfortable in her body.  Goal weight: 170lb.  Patient wishes to achieve the goal by: Pursuing either Saxenda or [facility] [address] though she is somewhat reluctant due to the possible risk of medullary thyroid cancer.  Though she notes that there is no family history or personal history of medullary thyroid cancer, multiple endocrine type II neoplasias, etc.  Medications tried in the past: None.  Side effects: N/A.  Diet is tried in the past: Weight watchers in her 77s and she was able to get down to about 115 pounds.  She is tried twice now in her 38s but is not able to maintain the regimen outlined by weight watchers.  She is also been on Octavia over-the-counter but this was too expensive and really did not taste good so it was not sustainable.  She is not used any over-the-counter weight loss supplements.  She does maintain a food diary.  She admits to drinking a Coca-Cola every day.    Possible weight altering medications: NEGATIVE: Atypical antipsychotics/ antidepressents, oral corticosteroids, Depo-Provera, insulin  Past medical history NEGATIVE for thyroid disease (including thyroid cancer), MEN, pancreatitis, diabetes, high blood pressure, high cholesterol, heart attack, stroke. Negative: Mental health disorders including addiction, depression, anxiety, bipolar disorder, OCD.  Family history NEGATIVE for thyroid disease (including thyroid cancer), MEN2  Past Medical History:  Diagnosis Date   Baker cyst    rt knee   Chest pain    Hyperlipidemia    Reflux     Current Outpatient Medications:    albuterol (VENTOLIN HFA) 108 (90 Base) MCG/ACT inhaler, INHALE 2 PUFFS INTO THE LUNGS EVERY 6 (SIX) HOURS AS NEEDED FOR WHEEZING OR  SHORTNESS OF BREATH, Disp: 8.5 each, Rfl: 2   fluticasone (FLONASE) 50 MCG/ACT nasal spray, Place 2 sprays into both nostrils daily., Disp: 48 g, Rfl: 1   pantoprazole (PROTONIX) 40 MG tablet, TAKE 1 TABLET BY MOUTH  DAILY, Disp: 90 tablet, Rfl: 0   rosuvastatin (CRESTOR) 20 MG tablet, TAKE 1 TABLET BY MOUTH EVERY DAY, Disp: 90 tablet, Rfl: 1   VITAMIN D, CHOLECALCIFEROL, PO, Take 1 tablet by mouth 2 (two) times a week. , Disp: , Rfl:  Social History   Socioeconomic History   Marital status: Married    Spouse name: Not on file   Number of children: 2   Years of education: Not on file   Highest education level: Not on file  Occupational History   Occupation: Accounting     Comment: Copy  Tobacco Use   Smoking status: Former    Packs/day: 1.00    Years: 18.00    Pack years: 18.00    Types: Cigarettes    Start date: 10/24/1977    Quit date: 07/09/1998    Years since quitting: 23.3   Smokeless tobacco: Never  Vaping Use   Vaping Use: Never used  Substance and Sexual Activity   Alcohol use: No    Comment: occasionally   Drug use: No   Sexual activity: Not on file  Other Topics Concern   Not on file  Social History Narrative   Lives with husband   Social Determinants of Health   Financial Resource Strain: Not on file  Food Insecurity: Not on  file  Transportation Needs: Not on file  Physical Activity: Not on file  Stress: Not on file  Social Connections: Not on file  Intimate Partner Violence: Not on file   Allergies  Allergen Reactions   Sulfonamide Derivatives Hives and Itching   Family History  Problem Relation Age of Onset   Diabetes Mother    Hypertension Brother    Diabetes type II Brother    Heart failure Maternal Grandmother        Died age 21   Hypertension Brother    Diabetes type II Brother    Colon cancer Neg Hx     ROS: Per HPI  Objective: Office vital signs reviewed. BP 101/69    Pulse 69    Temp 97.9 F (36.6 C)    Ht 5' (1.524  m)    Wt 185 lb 3.2 oz (84 kg)    SpO2 93%    BMI 36.17 kg/m   Physical Examination:  General: Awake, alert, morbidly obese, No acute distress HEENT: Sclera white.  Moist mucous membranes.  No exophthalmos or goiter Cardio: regular rate and rhythm  Pulm:  normal work of breathing on room air GI: Obese  Waist circumference: 44.75 inches  Depression screen [facility] 2/9 11/20/2021 01/14/2020 11/26/2018  Decreased Interest 0 0 0  Down, Depressed, Hopeless 0 0 1  PHQ - 2 Score 0 0 1  Altered sleeping 0 0 -  Tired, decreased energy 1 0 -  Change in appetite 2 0 -  Feeling bad or failure about yourself  0 0 -  Trouble concentrating 0 0 -  Moving slowly or fidgety/restless 0 0 -  Suicidal thoughts 0 0 -  PHQ-9 Score 3 0 -  Difficult doing work/chores Not difficult at all - -    GAD 7 : Generalized Anxiety Score 11/20/2021  Nervous, Anxious, on Edge 1  Control/stop worrying 1  Worry too much - different things 1  Trouble relaxing 0  Restless 0  Easily annoyed or irritable 0  Afraid - awful might happen 0  Total GAD 7 Score 3  Anxiety Difficulty Not difficult at all    Measurements: Waist circumference: 44.75 inches  Assessment/ Plan: 61 y.o. femalehere for initial weight management visit.  Morbid obesity ([place]) - Plan: Liraglutide -Weight Management (SAXENDA) 18 MG/3ML SOPN, Insulin Pen Needle 32G X 6 MM MISC, Bayer DCA Hb A1c Waived, CMP14+EGFR, CMP14+EGFR, Bayer DCA Hb A1c Waived  Weight loss counseling, encounter for - Plan: Liraglutide -Weight Management (SAXENDA) 18 MG/3ML SOPN, Insulin Pen Needle 32G X 6 MM MISC, CMP14+EGFR, CMP14+EGFR  Pure hypercholesterolemia - Plan: Liraglutide -Weight Management (SAXENDA) 18 MG/3ML SOPN, Insulin Pen Needle 32G X 6 MM MISC, Lipid panel, CMP14+EGFR, CMP14+EGFR, Lipid panel  Elevated blood sugar - Plan: Liraglutide -Weight Management (SAXENDA) 18 MG/3ML SOPN, Insulin Pen Needle 32G X 6 MM MISC, Bayer DCA Hb A1c Waived, CMP14+EGFR, CMP14+EGFR,  Bayer DCA Hb A1c Waived  Discussed options for treatment including Wellbutrin, phentermine, GLP.  She wishes to pursue a GLP.  Risks versus benefits discussed.  Possible side effects and recommendations discussed and a handout was provided today.  I have given her a sample pack of Saxenda.  Anticipate need for prior authorization for this medication.  We will check A1c, lipid, CMP given history of hypercholesterolemia and elevated blood sugar.  She certainly fits the picture of metabolic syndrome.  Would like to see her back in about 5 to 6 weeks for recheck, sooner if concerns arise  Janora Norlander, DO [place] [phone]

## 2021-11-20 NOTE — Telephone Encounter (Signed)
(  Key: [id number]) Saxenda 18MG /3ML pen-injectors  Waiting on question reviews

## 2021-11-21 LAB — CMP14+EGFR
ALT: 15 IU/L (ref 0–32)
AST: 19 IU/L (ref 0–40)
Albumin/Globulin Ratio: 1.8 (ref 1.2–2.2)
Albumin: 4.4 g/dL (ref 3.8–4.9)
Alkaline Phosphatase: 93 IU/L (ref 44–121)
BUN/Creatinine Ratio: 14 (ref 9–23)
BUN: 11 mg/dL (ref 6–24)
Bilirubin Total: 0.3 mg/dL (ref 0.0–1.2)
CO2: 22 mmol/L (ref 20–29)
Calcium: 9.2 mg/dL (ref 8.7–10.2)
Chloride: 107 mmol/L — ABNORMAL HIGH (ref 96–106)
Creatinine, Ser: 0.8 mg/dL (ref 0.57–1.00)
Globulin, Total: 2.5 g/dL (ref 1.5–4.5)
Glucose: 120 mg/dL — ABNORMAL HIGH (ref 70–99)
Potassium: 4.2 mmol/L (ref 3.5–5.2)
Sodium: 144 mmol/L (ref 134–144)
Total Protein: 6.9 g/dL (ref 6.0–8.5)
eGFR: 85 mL/min/{1.73_m2} (ref >59)

## 2021-11-21 LAB — LIPID PANEL
Chol/HDL Ratio: 2.9 ratio (ref 0.0–4.4)
Cholesterol, Total: 134 mg/dL (ref 100–199)
HDL: 46 mg/dL (ref >39)
LDL Chol Calc (NIH): 65 mg/dL (ref 0–99)
Triglycerides: 129 mg/dL (ref 0–149)
VLDL Cholesterol Cal: 23 mg/dL (ref 5–40)

## 2021-11-21 NOTE — Telephone Encounter (Signed)
Saxenda DENIED-  [facility], the utilization review entity for [employer], received a request for coverage of Saxenda 18MG /3ML [place] SOPN for you. Weve denied the request for the following reason(s): Plan Exclusion Your provider may have sent diagnosis and treatment codes with your request for authorization of services. You may obtain these codes and their descriptions by calling us. Our number is on your member ID card. If you have questions about your diagnosis or your treatment, please contact your provider. We do not use diagnosis codes and therefore do not provide them. Information about coverage denials We based this coverage denial on the terms of your benefit plan. Your plan does not cover services that are not medically necessary or that are contractually excluded. For a full explanation of the coverage available, please review your Certificate of Coverage. The Pharmacy Benefit Exclusions section provides details.

## 2021-11-21 NOTE — Telephone Encounter (Signed)
How do we pursue a plan exception?  Her insurance said that we could try that.

## 2021-11-23 NOTE — Telephone Encounter (Signed)
In my experience --if rejection says coverage exclusion or plan exclusion then appeals or forms do not work and patient cannot get drug  If she can call insurance and they want to fax [place] forms-->we can try, but every time I have in the past--> it is denied

## 2021-11-25 NOTE — Telephone Encounter (Signed)
Please inform the patient of Julie's recommendations as below

## 2021-11-26 ENCOUNTER — Telehealth: Payer: Self-pay | Admitting: Family Medicine

## 2021-11-26 NOTE — Telephone Encounter (Signed)
Pt aware.

## 2021-11-26 NOTE — Telephone Encounter (Signed)
Med Change Request (Newest Message First) You Just now (1:44 PM)   Pt aware      Note    You  Josephina, Melcher E Just now (1:43 PM)   You 35 minutes ago (1:08 PM)   Lmtcb       Note    You  [place], Kestner E 36 minutes ago (1:08 PM)   Gottschalk, Livingston M, DO  [facility] Clinical Pool 19 hours ago (5:50 PM)   Please inform the patient of Julie's recommendations as below        Note    Lavera Guise, [place] routed conversation to Janora Norlander, DO 3 days ago   Lavera Guise, [facility] 3 days ago   In my experience --if rejection says coverage exclusion or plan exclusion then appeals or forms do not work and patient cannot get drug   If she can call insurance and they want to fax [place] forms-->we can try, but every time I have in the past--> it is denied            Note    Ronnie Doss M, DO  Pruitt, Julie D, RPH 5 days ago   How do we pursue a plan exception?  Her insurance said that we could try that.      Note    [place], Donny Pique, LPN routed conversation to [employer], DO 5 days ago   [place], Donny Pique, LPN 5 days ago   Saxenda DENIED-   [facility], the utilization review entity for [employer], received a request for coverage of Saxenda 18MG /3ML [place] SOPN for you. Weve denied the request for the following reason(s): Plan Exclusion Your provider may have sent diagnosis and treatment codes with your request for authorization of services. You may obtain these codes and their descriptions by calling us. Our number is on your member ID card. If you have questions about your diagnosis or your treatment, please contact your provider. We do not use diagnosis codes and therefore do not provide them. Information about coverage denials We based this coverage denial on the terms of your benefit plan. Your plan does not cover services that are not medically necessary or that are contractually excluded. For a full explanation of the  coverage available, please review your Certificate of Coverage. The Pharmacy Benefit Exclusions section provides details.      Note    Thana Ates, LPN routed conversation to Thana Ates, LPN 6 days ago   Thana Ates, LPN routed conversation to [employer] Prior Auth 6 days ago   Thana Ates, LPN routed conversation to [employer] Prior Auth 6 days ago   Thana Ates, LPN 6 days ago   (Key: [id number]) Kirke Shaggy 18MG [place] Mediate pen-injectors   Waiting on question reviews      Note    Michaela Corner, LPN  [facility] Prior Auth 6 days ago   Wasn't sure if a prior auth could be done for this, Dr. Marjean Donna notes mention metabolic syndrome

## 2021-11-26 NOTE — Telephone Encounter (Signed)
Lmtcb.

## 2021-12-03 ENCOUNTER — Other Ambulatory Visit: Payer: Self-pay | Admitting: Family Medicine

## 2021-12-03 DIAGNOSIS — R059 Cough, unspecified: Secondary | ICD-10-CM

## 2021-12-04 ENCOUNTER — Other Ambulatory Visit: Payer: Self-pay | Admitting: Family Medicine

## 2021-12-04 DIAGNOSIS — R059 Cough, unspecified: Secondary | ICD-10-CM

## 2022-01-01 ENCOUNTER — Encounter: Payer: Self-pay | Admitting: Family Medicine

## 2022-01-02 ENCOUNTER — Other Ambulatory Visit: Payer: Self-pay | Admitting: Family Medicine

## 2022-01-02 ENCOUNTER — Ambulatory Visit: Payer: No Typology Code available for payment source | Admitting: Family Medicine

## 2022-01-02 DIAGNOSIS — Z1211 Encounter for screening for malignant neoplasm of colon: Secondary | ICD-10-CM

## 2022-01-11 ENCOUNTER — Encounter: Payer: Self-pay | Admitting: Nurse Practitioner

## 2022-01-23 NOTE — Progress Notes (Signed)
? ? ? ?01/23/2022 ?[name] ?[id number] ?[birth year] ? ? ?CHIEF COMPLAINT: Schedule colonoscopy ? ?HISTORY OF PRESENT ILLNESS: Rachel Larson is a 61 year old female with a past medical history of hyperlipidemia, obesity, COVID-19 07/2020 treated with monoclonal antibody and sleep apnea (does not using CPAP). She presents to our office today as referred by Dr. Lajuana Ripple to schedule a colonoscopy.  She complains of seeing a small amount of bright red blood on the toilet tissue which occurs 5-6 times yearly for the past 2 years.  She occasionally has fecal leakage which causes the anal irritation.  No lower abdominal pain.  She passes a normal formed brown bowel movement once daily, sometimes skips a day.  No melena. She underwent a colonoscopy by Dr. Olevia Perches 10/13/2013 which was normal. She has a history of GERD symptoms. She takes Protonix on and off for the past 4 years for heartburn and abdominal bloat symptoms.  She typically takes Protonix for 2 weeks intervals every few months.  She underwent an EGD in 2008 or 2009 at [facility] which she reported was normal.  She reports having a history of mild shortness of breath for which she underwent negative cardiac and pulmonary evaluation.  She was seen by cardiologist Dr. Percival Spanish for shortness of breath 08/2020 and he reviewed her exercise treadmill test in 2018 and coronary CT in 2020 were negative.  He felt her shortness of breath was due to her weight.  No further cardiac evaluation was recommended.  She was evaluated by pulmonologist Dr. Ander Slade 08/2020 after she tested positive for COVID.  PFTs in 2019 were normal.  She quit smoking cigarettes in 1999.  She was advised to use CPAP daily and to exercise on a regular basis.  She attributes her mild shortness of breath to to being overweight.  She uses albuterol as needed.  No shortness of breath at this time.  No chest pain.  No known family history of esophageal gastric or colon cancer.  No other  complaints at this time. ? ? ?  Latest Ref Rng & Units 11/20/2021  ?  9:23 AM 01/16/2021  ?  8:54 AM 01/14/2020  ?  8:55 AM  ?CMP  ?Glucose 70 - 99 mg/dL 120   125   113    ?BUN 6 - 24 mg/dL '11   18   13    '$ ?Creatinine 0.57 - 1.00 mg/dL 0.80   0.86   0.80    ?Sodium 134 - 144 mmol/L 144   142   142    ?Potassium 3.5 - 5.2 mmol/L 4.2   4.1   4.3    ?Chloride 96 - 106 mmol/L 107   103   107    ?CO2 20 - 29 mmol/L '22   21   21    '$ ?Calcium 8.7 - 10.2 mg/dL 9.2   9.7   9.3    ?Total Protein 6.0 - 8.5 g/dL 6.9   7.3   6.7    ?Total Bilirubin 0.0 - 1.2 mg/dL 0.3   0.5   0.3    ?Alkaline Phos 44 - 121 IU/L 93   97   100    ?AST 0 - 40 IU/L '19   19   16    '$ ?ALT 0 - 32 IU/L '15   17   12    '$ ? ? ?  Latest Ref Rng & Units 11/26/2018  ?  4:42 PM 05/27/2018  ?  4:27 PM 11/26/2017  ?  4:33 PM  ?CBC  ?WBC 3.4 - 10.8 x10E3/uL 5.4   5.4   6.5    ?Hemoglobin 11.1 - 15.9 g/dL 12.3   12.7   13.3    ?Hematocrit 34.0 - 46.6 % 35.6   37.3   38.1    ?Platelets 150 - 450 x10E3/uL 181   192   194    ?  ? ?Colonoscopy by Dr. Olevia Perches 10/13/2013: ?Normal colonoscopy  ?Recall colonoscopy 10 years   ? ?Past Medical History:  ?Diagnosis Date  ? Baker cyst   ? rt knee  ? Chest pain   ? Hyperlipidemia   ? Reflux   ? ?Past Surgical History:  ?Procedure Laterality Date  ? CHOLECYSTECTOMY    ? NASAL SINUS SURGERY    ? ?Social History: She is married.  She has 2 sons.  She is a Teacher, music.  She smoked 1ppd x 18 years, quite in 1999. She drinks one glass of wine or mixed drink twice weekly.  ? ?Family History: family history includes Diabetes in her mother; Diabetes type II in her brother and brother; Heart failure in her maternal grandmother; Hypertension in her brother and brother.  No known family history of esophageal, gastric or colon cancer. ? ?Allergies  ?Allergen Reactions  ? Sulfonamide Derivatives Hives and Itching  ? ? ?  ?Outpatient Encounter Medications as of 01/24/2022  ?Medication Sig  ? albuterol (VENTOLIN HFA) 108 (90 Base) MCG/ACT inhaler  INHALE 2 PUFFS INTO THE LUNGS EVERY 6 (SIX) HOURS AS NEEDED FOR WHEEZING OR SHORTNESS OF BREATH (Patient not taking: Reported on 11/20/2021)  ? fluticasone (FLONASE) 50 MCG/ACT nasal spray Place 2 sprays into both nostrils daily.  ? pantoprazole (PROTONIX) 40 MG tablet Take 1 tablet (40 mg total) by mouth daily.  ? rosuvastatin (CRESTOR) 20 MG tablet TAKE 1 TABLET BY MOUTH EVERY DAY  ? VITAMIN D, CHOLECALCIFEROL, PO Take 1 tablet by mouth 2 (two) times a week.   ? ?No facility-administered encounter medications on file as of 01/24/2022.  ? ? ?REVIEW OF SYSTEMS: . ?Gen: Denies fever, sweats or chills. No weight loss.  ?CV: Denies chest pain, palpitations or edema. ?Resp: See HPI.  ?GI: See HPI ?GU : Denies urinary burning, blood in urine, increased urinary frequency or incontinence. ?MS: Denies joint pain, muscles aches or weakness. ?Derm: Denies rash, itchiness, skin lesions or unhealing ulcers. ?Psych: Denies depression, anxiety or memory loss ?Heme: Denies bruising, easy bleeding. ?Neuro:  Denies headaches, dizziness or paresthesias. ?Endo:  Denies any problems with DM, thyroid or adrenal function. ? ?PHYSICAL EXAM: ?BP 126/70   Pulse 65   Ht 5' (1.524 m)   Wt 184 lb 9.6 oz (83.7 kg)   SpO2 97%   BMI 36.05 kg/m?  ? ?General: 61 year old female in no acute distress. ?Head: Normocephalic and atraumatic. ?Eyes:  Sclerae non-icteric, conjunctive pink. ?Ears: Normal auditory acuity. ?Mouth: Dentition intact. No ulcers or lesions.  ?Neck: Supple, no lymphadenopathy or thyromegaly.  ?Lungs: Clear bilaterally to auscultation without wheezes, crackles or rhonchi. ?Heart: Regular rate and rhythm. No murmur, rub or gallop appreciated.  ?Abdomen: Soft, nontender, non distended. No masses. No hepatosplenomegaly. Normoactive bowel sounds x 4 quadrants.  ?Rectal: Deferred ?Musculoskeletal: Symmetrical with no gross deformities. ?Skin: Warm and dry. No rash or lesions on visible extremities. ?Extremities: No  edema. ?Neurological: Alert oriented x 4, no focal deficits.  ?Psychological:  Alert and cooperative. Normal mood and affect. ? ?ASSESSMENT AND PLAN: ? ?31) 61 year old female with intermittent rectal  bleeding ?-Colonoscopy benefits and risks discussed including risk with sedation, risk of bleeding, perforation and infection  ?-Benefiber 1 tablespoon daily to avoid straining ?-Patient to contact office if symptoms worsen ?-Check CBC if rectal bleeding recurs, otherwise annual CBC with PCP recommended  ? ?2) GERD, recurrent symptoms.  She takes PPI daily for 2-week intervals as needed. ?-EGD benefits and risks discussed including risk with sedation, risk of bleeding, perforation and infection  ?-GERD handout ? ?Further recommendations to be determined after the above evaluation completed ? ? ? ? ? ? ? ? ? ?CC:  Janora Norlander, DO ? ? ? ?

## 2022-01-24 ENCOUNTER — Ambulatory Visit: Payer: No Typology Code available for payment source | Admitting: Nurse Practitioner

## 2022-01-24 ENCOUNTER — Encounter: Payer: Self-pay | Admitting: Nurse Practitioner

## 2022-01-24 VITALS — BP 126/70 | HR 65 | Ht 60.0 in | Wt 184.6 lb

## 2022-01-24 DIAGNOSIS — K625 Hemorrhage of anus and rectum: Secondary | ICD-10-CM

## 2022-01-24 DIAGNOSIS — K219 Gastro-esophageal reflux disease without esophagitis: Secondary | ICD-10-CM

## 2022-01-24 MED ORDER — PANTOPRAZOLE SODIUM 40 MG PO TBEC
40.0000 mg | DELAYED_RELEASE_TABLET | Freq: Every day | ORAL | 1 refills | Status: DC
Start: 2022-01-24 — End: 2022-07-03

## 2022-01-24 NOTE — Patient Instructions (Addendum)
You have been scheduled for an EGD and Colonoscopy. Please follow the written instructions given to you at your visit today. ?If you use inhalers (even only as needed), please bring them with you on the day of your procedure. ? ?We have sent the following medication to your pharmacy for you to pick up at your convenience: ? ?Pantoprazole 40 MG once a day. ? ?RECOMMENDATIONS: ? ?Benefiber- 1 tablespoon daily. ? ?Gastroesophageal Reflux Disease, Adult ?Gastroesophageal reflux (GER) happens when acid from the stomach flows up into the tube that connects the mouth and the stomach (esophagus). Normally, food travels down the esophagus and stays in the stomach to be digested. With GER, food and stomach acid sometimes move back up into the esophagus. ?You may have a disease called gastroesophageal reflux disease (GERD) if the reflux: ?Happens often. ?Causes frequent or very bad symptoms. ?Causes problems such as damage to the esophagus. ?When this happens, the esophagus becomes sore and swollen. Over time, GERD can make small holes (ulcers) in the lining of the esophagus. ?What are the causes? ?This condition is caused by a problem with the muscle between the esophagus and the stomach. When this muscle is weak or not normal, it does not close properly to keep food and acid from coming back up from the stomach. ?The muscle can be weak because of: ?Tobacco use. ?Pregnancy. ?Having a certain type of hernia (hiatal hernia). ?Alcohol use. ?Certain foods and drinks, such as coffee, chocolate, onions, and peppermint. ?What increases the risk? ?Being overweight. ?Having a disease that affects your connective tissue. ?Taking NSAIDs, such a ibuprofen. ?What are the signs or symptoms? ?Heartburn. ?Difficult or painful swallowing. ?The feeling of having a lump in the throat. ?A bitter taste in the mouth. ?Bad breath. ?Having a lot of saliva. ?Having an upset or bloated stomach. ?Burping. ?Chest pain. Different conditions can cause  chest pain. Make sure you see your doctor if you have chest pain. ?Shortness of breath or wheezing. ?A long-term cough or a cough at night. ?Wearing away of the surface of teeth (tooth enamel). ?Weight loss. ?How is this treated? ?Making changes to your diet. ?Taking medicine. ?Having surgery. ?Treatment will depend on how bad your symptoms are. ?Follow these instructions at home: ?Eating and drinking ? ?Follow a diet as told by your doctor. You may need to avoid foods and drinks such as: ?Coffee and tea, with or without caffeine. ?Drinks that contain alcohol. ?Energy drinks and sports drinks. ?Bubbly (carbonated) drinks or sodas. ?Chocolate and cocoa. ?Peppermint and mint flavorings. ?Garlic and onions. ?Horseradish. ?Spicy and acidic foods. These include peppers, chili powder, curry powder, vinegar, hot sauces, and BBQ sauce. ?Citrus fruit juices and citrus fruits, such as oranges, lemons, and limes. ?Tomato-based foods. These include red sauce, chili, salsa, and pizza with red sauce. ?Fried and fatty foods. These include donuts, french fries, potato chips, and high-fat dressings. ?High-fat meats. These include hot dogs, rib eye steak, sausage, ham, and bacon. ?High-fat dairy items, such as whole milk, butter, and cream cheese. ?Eat small meals often. Avoid eating large meals. ?Avoid drinking large amounts of liquid with your meals. ?Avoid eating meals during the 2-3 hours before bedtime. ?Avoid lying down right after you eat. ?Do not exercise right after you eat. ?Lifestyle ? ?Do not smoke or use any products that contain nicotine or tobacco. If you need help quitting, ask your doctor. ?Try to lower your stress. If you need help doing this, ask your doctor. ?If you are overweight, lose an  amount of weight that is healthy for you. Ask your doctor about a safe weight loss goal. ?General instructions ?Pay attention to any changes in your symptoms. ?Take over-the-counter and prescription medicines only as told by  your doctor. ?Do not take aspirin, ibuprofen, or other NSAIDs unless your doctor says it is okay. ?Wear loose clothes. Do not wear anything tight around your waist. ?Raise (elevate) the head of your bed about 6 inches (15 cm). You may need to use a wedge to do this. ?Avoid bending over if this makes your symptoms worse. ?Keep all follow-up visits. ?Contact a doctor if: ?You have new symptoms. ?You lose weight and you do not know why. ?You have trouble swallowing or it hurts to swallow. ?You have wheezing or a cough that keeps happening. ?You have a hoarse voice. ?Your symptoms do not get better with treatment. ?Get help right away if: ?You have sudden pain in your arms, neck, jaw, teeth, or back. ?You suddenly feel sweaty, dizzy, or light-headed. ?You have chest pain or shortness of breath. ?You vomit and the vomit is green, yellow, or black, or it looks like blood or coffee grounds. ?You faint. ?Your poop (stool) is red, bloody, or black. ?You cannot swallow, drink, or eat. ?These symptoms may represent a serious problem that is an emergency. Do not wait to see if the symptoms will go away. Get medical help right away. Call your local emergency services (911 in the [place] Do not drive yourself to the hospital. ?Summary ?If a person has gastroesophageal reflux disease (GERD), food and stomach acid move back up into the esophagus and cause symptoms or problems such as damage to the esophagus. ?Treatment will depend on how bad your symptoms are. ?Follow a diet as told by your doctor. ?Take all medicines only as told by your doctor. ?This information is not intended to replace advice given to you by your health care provider. Make sure you discuss any questions you have with your health care provider. ?Document Revised: 04/17/2020 Document Reviewed: 04/17/2020 ?Elsevier Patient Education ? [place]. ? ?Thank you for trusting me with your gastrointestinal care!   ? ?Noralyn Pick,  CRNP ? ? ? ?BMI: ? ?If you are age 52 or older, your body mass index should be between 23-30. Your Body mass index is 36.05 kg/m?Marland Kitchen If this is out of the aforementioned range listed, please consider follow up with your Primary Care Provider. ? ?If you are age 6 or younger, your body mass index should be between 19-25. Your Body mass index is 36.05 kg/m?Marland Kitchen If this is out of the aformentioned range listed, please consider follow up with your Primary Care Provider.  ? ?MY CHART: ? ?The [facility] providers would like to encourage you to use [facility] to communicate with providers for non-urgent requests or questions.  Due to long hold times on the telephone, sending your provider a message by [facility] may be a faster and more efficient way to get a response.  Please allow 48 business hours for a response.  Please remember that this is for non-urgent requests.  ? ? ?   ?  ? ? ?

## 2022-01-27 NOTE — Progress Notes (Signed)
I agree with the assessment and plan as outlined by Ms. Kennedy-Smith. 

## 2022-02-26 ENCOUNTER — Telehealth: Payer: Self-pay | Admitting: Nurse Practitioner

## 2022-02-26 NOTE — Telephone Encounter (Signed)
Inbound call from patient needing to reschedule her procedure. Patient stated that she needed new prep instructions with the new dates and times. Patient was rescheduled for 6/15 at 1:30.  ?

## 2022-03-07 ENCOUNTER — Encounter: Payer: No Typology Code available for payment source | Admitting: Internal Medicine

## 2022-03-28 ENCOUNTER — Ambulatory Visit: Payer: No Typology Code available for payment source

## 2022-04-04 ENCOUNTER — Encounter: Payer: No Typology Code available for payment source | Admitting: Internal Medicine

## 2022-04-12 ENCOUNTER — Ambulatory Visit: Payer: No Typology Code available for payment source

## 2022-05-02 ENCOUNTER — Encounter: Payer: Self-pay | Admitting: Internal Medicine

## 2022-05-05 ENCOUNTER — Encounter: Payer: Self-pay | Admitting: Certified Registered Nurse Anesthetist

## 2022-05-09 ENCOUNTER — Ambulatory Visit (AMBULATORY_SURGERY_CENTER): Payer: No Typology Code available for payment source | Admitting: Internal Medicine

## 2022-05-09 ENCOUNTER — Encounter: Payer: Self-pay | Admitting: Internal Medicine

## 2022-05-09 VITALS — BP 148/70 | HR 77 | Temp 98.4°F | Resp 14 | Ht 60.0 in | Wt 184.0 lb

## 2022-05-09 DIAGNOSIS — K5730 Diverticulosis of large intestine without perforation or abscess without bleeding: Secondary | ICD-10-CM

## 2022-05-09 DIAGNOSIS — K2970 Gastritis, unspecified, without bleeding: Secondary | ICD-10-CM | POA: Diagnosis not present

## 2022-05-09 DIAGNOSIS — K222 Esophageal obstruction: Secondary | ICD-10-CM

## 2022-05-09 DIAGNOSIS — K635 Polyp of colon: Secondary | ICD-10-CM

## 2022-05-09 DIAGNOSIS — K2980 Duodenitis without bleeding: Secondary | ICD-10-CM | POA: Diagnosis not present

## 2022-05-09 DIAGNOSIS — K219 Gastro-esophageal reflux disease without esophagitis: Secondary | ICD-10-CM

## 2022-05-09 DIAGNOSIS — K649 Unspecified hemorrhoids: Secondary | ICD-10-CM

## 2022-05-09 DIAGNOSIS — K259 Gastric ulcer, unspecified as acute or chronic, without hemorrhage or perforation: Secondary | ICD-10-CM

## 2022-05-09 DIAGNOSIS — K317 Polyp of stomach and duodenum: Secondary | ICD-10-CM

## 2022-05-09 DIAGNOSIS — R12 Heartburn: Secondary | ICD-10-CM

## 2022-05-09 DIAGNOSIS — D125 Benign neoplasm of sigmoid colon: Secondary | ICD-10-CM

## 2022-05-09 DIAGNOSIS — K625 Hemorrhage of anus and rectum: Secondary | ICD-10-CM | POA: Diagnosis not present

## 2022-05-09 MED ORDER — HYDROCORTISONE (PERIANAL) 2.5 % EX CREA: 1.0000 | TOPICAL_CREAM | Freq: Two times a day (BID) | CUTANEOUS | 1 refills | Status: DC

## 2022-05-09 MED ORDER — SODIUM CHLORIDE 0.9 % IV SOLN: 500.0000 mL | Freq: Once | INTRAVENOUS | Status: DC

## 2022-05-09 MED ORDER — PANTOPRAZOLE SODIUM 40 MG PO TBEC: 40.0000 mg | DELAYED_RELEASE_TABLET | Freq: Two times a day (BID) | ORAL | 3 refills | Status: DC

## 2022-05-09 NOTE — Op Note (Signed)
[place] Patient Name: Rachel Larson Procedure Date: 05/09/2022 1:38 PM MRN: [id number] Endoscopist: Sonny Masters "Rachel Larson ,  Age: 61 Referring MD:  Date of Birth: [birth year] Gender: Female Account #: [id number] Procedure:                Colonoscopy Indications:              Rectal bleeding Medicines:                Monitored Anesthesia Care Procedure:                Pre-Anesthesia Assessment:                           - Prior to the procedure, a History and Physical                            was performed, and patient medications and                            allergies were reviewed. The patient's tolerance of                            previous anesthesia was also reviewed. The risks                            and benefits of the procedure and the sedation                            options and risks were discussed with the patient.                            All questions were answered, and informed consent                            was obtained. Prior Anticoagulants: The patient has                            taken no previous anticoagulant or antiplatelet                            agents. ASA Grade Assessment: II - A patient with                            mild systemic disease. After reviewing the risks                            and benefits, the patient was deemed in                            satisfactory condition to undergo the procedure.                           After obtaining informed consent, the colonoscope  was passed under direct vision. Throughout the                            procedure, the patient's blood pressure, pulse, and                            oxygen saturations were monitored continuously. The                            Olympus Colonoscope [id number] was introduced through                            the anus and advanced to the the terminal ileum.                            The colonoscopy was performed without  difficulty.                            The patient tolerated the procedure well. The                            quality of the bowel preparation was good. The                            terminal ileum, ileocecal valve, appendiceal                            orifice, and rectum were photographed. Scope In: 1:58:03 PM Scope Out: 2:16:14 PM Scope Withdrawal Time: 0 hours 15 minutes 5 seconds  Total Procedure Duration: 0 hours 18 minutes 11 seconds  Findings:                 The terminal ileum appeared normal.                           A 3 mm polyp was found in the sigmoid colon. The                            polyp was sessile. The polyp was removed with a                            cold snare. Resection and retrieval were complete.                           Diverticula were found in the sigmoid colon.                           Non-bleeding internal hemorrhoids were found during                            retroflexion. Complications:            No immediate complications. Estimated Blood Loss:     Estimated blood loss was minimal. Impression:               -  The examined portion of the ileum was normal.                           - One 3 mm polyp in the sigmoid colon, removed with                            a cold snare. Resected and retrieved.                           - Diverticulosis in the sigmoid colon.                           - Non-bleeding internal hemorrhoids. Recommendation:           - Discharge patient to home (with escort).                           - It is suspected that your rectal bleeding is due                            to hemorrhoids.                           - Await pathology results.                           - Anusol HC cream BID for 7 days.                           - Take daily fiber supplement and try to keep                            stools soft by taking a stool softener as needed.                           - Return to GI clinic in 6 weeks.                            - The findings and recommendations were discussed                            with the patient. [address]Christia Larson,  05/09/2022 2:24:23 PM

## 2022-05-09 NOTE — Progress Notes (Signed)
Pt's states no medical or surgical changes since previsit or office visit. 

## 2022-05-09 NOTE — Progress Notes (Signed)
Report given to PACU, vss 

## 2022-05-09 NOTE — Progress Notes (Signed)
1337 Robinul 0.1 mg IV given due large amount of secretions upon assessment.  MD made aware, vss 

## 2022-05-09 NOTE — Progress Notes (Signed)
GASTROENTEROLOGY PROCEDURE H&P NOTE   Primary Care Physician: Janora Norlander, DO    Reason for Procedure:   Rectal bleeding, recurrent GERD  Plan:    EGD/colonoscopy  Patient is appropriate for endoscopic procedure(s) in the ambulatory ([place]) setting.  The nature of the procedure, as well as the risks, benefits, and alternatives were carefully and thoroughly reviewed with the patient. Ample time for discussion and questions allowed. The patient understood, was satisfied, and agreed to proceed.     HPI: Rachel Larson is a 61 y.o. female who presents for EGD/colonoscopy for evaluation of rectal bleeding and recurrent GERD.  Patient was most recently seen in the Gastroenterology Clinic on 01/24/22.  No interval change in medical history since that appointment. Please refer to that note for full details regarding GI history and clinical presentation.   Past Medical History:  Diagnosis Date   Allergy    Baker cyst    rt knee   Chest pain    GERD (gastroesophageal reflux disease)    Hyperlipidemia    Reflux    Sleep apnea     Past Surgical History:  Procedure Laterality Date   CHOLECYSTECTOMY  2009   NASAL SINUS SURGERY     TUBAL LIGATION      Prior to Admission medications   Medication Sig Start Date End Date Taking? Authorizing Provider  pantoprazole (PROTONIX) 40 MG tablet Take 1 tablet (40 mg total) by mouth daily. Take 30 minutes before breakfast. 01/24/22  Yes Noralyn Pick, NP  rosuvastatin (CRESTOR) 20 MG tablet TAKE 1 TABLET BY MOUTH EVERY DAY 08/10/21  Yes Ronnie Doss M, DO  tretinoin (RETIN-A) 0.05 % cream SMARTSIG:sparingly Topical Every Night PRN 05/01/22  Yes [provider]  VITAMIN D, CHOLECALCIFEROL, PO Take 1 tablet by mouth 2 (two) times a week.    Yes [provider]  fluticasone (FLONASE) 50 MCG/ACT nasal spray Place 2 sprays into both nostrils daily. 01/16/21   Janora Norlander, DO    Current Outpatient  Medications  Medication Sig Dispense Refill   pantoprazole (PROTONIX) 40 MG tablet Take 1 tablet (40 mg total) by mouth daily. Take 30 minutes before breakfast. 90 tablet 1   rosuvastatin (CRESTOR) 20 MG tablet TAKE 1 TABLET BY MOUTH EVERY DAY 90 tablet 1   tretinoin (RETIN-A) 0.05 % cream SMARTSIG:sparingly Topical Every Night PRN     VITAMIN D, CHOLECALCIFEROL, PO Take 1 tablet by mouth 2 (two) times a week.      fluticasone (FLONASE) 50 MCG/ACT nasal spray Place 2 sprays into both nostrils daily. 48 g 1   Current Facility-Administered Medications  Medication Dose Route Frequency Provider Last Rate Last Admin   0.9 %  sodium chloride infusion  500 mL Intravenous Once Sharyn Creamer, MD        Allergies as of 05/09/2022 - Review Complete 05/09/2022  Allergen Reaction Noted   Sulfonamide derivatives Hives and Itching 06/26/2011    Family History  Problem Relation Age of Onset   Diabetes Mother    Irritable bowel syndrome Mother    Hypertension Brother    Diabetes type II Brother    Hypertension Brother    Diabetes type II Brother    Heart failure Maternal Grandmother        Died age 55   Colon cancer Neg Hx    Stomach cancer Neg Hx    Esophageal cancer Neg Hx    Colon polyps Neg Hx    Rectal cancer  Neg Hx     Social History   Socioeconomic History   Marital status: Married    Spouse name: Not on file   Number of children: 2   Years of education: Not on file   Highest education level: Not on file  Occupational History   Occupation: Accounting     Comment: Copy   Occupation: Medical Records Clerk  Tobacco Use   Smoking status: Former    Packs/day: 1.00    Years: 18.00    Total pack years: 18.00    Types: Cigarettes    Start date: 10/24/1977    Quit date: 07/09/1998    Years since quitting: 23.8   Smokeless tobacco: Never  Vaping Use   Vaping Use: Never used  Substance and Sexual Activity   Alcohol use: Yes    Comment: occasionally   Drug use:  No   Sexual activity: Not on file  Other Topics Concern   Not on file  Social History Narrative   Lives with husband   Social Determinants of Health   Financial Resource Strain: Not on file  Food Insecurity: Not on file  Transportation Needs: Not on file  Physical Activity: Not on file  Stress: Not on file  Social Connections: Not on file  Intimate Partner Violence: Not on file    Physical Exam: Vital signs in last 24 hours: BP 116/66   Pulse 70   Temp 98.4 F (36.9 C)   Ht 5' (1.524 m)   Wt 184 lb (83.5 kg)   SpO2 96%   BMI 35.94 kg/m  GEN: NAD EYE: Sclerae anicteric ENT: MMM CV: Non-tachycardic Pulm: No increased WOB GI: Soft NEURO:  Alert & Oriented   [name], MD [facility]   05/09/2022 1:35 PM

## 2022-05-09 NOTE — Progress Notes (Signed)
Called to room to assist during endoscopic procedure.  Patient ID and intended procedure confirmed with present staff. Received instructions for my participation in the procedure from the performing physician.  

## 2022-05-09 NOTE — Patient Instructions (Signed)
Handout on polyps given. Take fiber supplement daily.  No ibuprofen, naproxen, or other non-steroidal anti-inflammatory drugs. Pick up medications Protonix and Anusol.    YOU HAD AN ENDOSCOPIC PROCEDURE TODAY AT [facility]:   Refer to the procedure report that was given to you for any specific questions about what was found during the examination.  If the procedure report does not answer your questions, please call your gastroenterologist to clarify.  If you requested that your care partner not be given the details of your procedure findings, then the procedure report has been included in a sealed envelope for you to review at your convenience later.  YOU SHOULD EXPECT: Some feelings of bloating in the abdomen. Passage of more gas than usual.  Walking can help get rid of the air that was put into your GI tract during the procedure and reduce the bloating. If you had a lower endoscopy (such as a colonoscopy or flexible sigmoidoscopy) you may notice spotting of blood in your stool or on the toilet paper. If you underwent a bowel prep for your procedure, you may not have a normal bowel movement for a few days.  Please Note:  You might notice some irritation and congestion in your nose or some drainage.  This is from the oxygen used during your procedure.  There is no need for concern and it should clear up in a day or so.  SYMPTOMS TO REPORT IMMEDIATELY:  Following lower endoscopy (colonoscopy or flexible sigmoidoscopy):  Excessive amounts of blood in the stool  Significant tenderness or worsening of abdominal pains  Swelling of the abdomen that is new, acute  Fever of 100F or higher  Following upper endoscopy (EGD)  Vomiting of blood or coffee ground material  New chest pain or pain under the shoulder blades  Painful or persistently difficult swallowing  New shortness of breath  Fever of 100F or higher  Black, tarry-looking stools  For urgent or emergent issues, a  gastroenterologist can be reached at any hour by calling [phone]. Do not use MyChart messaging for urgent concerns.    DIET:  We do recommend a small meal at first, but then you may proceed to your regular diet.  Drink plenty of fluids but you should avoid alcoholic beverages for 24 hours.  ACTIVITY:  You should plan to take it easy for the rest of today and you should NOT DRIVE or use heavy machinery until tomorrow (because of the sedation medicines used during the test).    FOLLOW UP: Our staff will call the number listed on your records the next business day following your procedure.  We will call around 7:15- 8:00 am to check on you and address any questions or concerns that you may have regarding the information given to you following your procedure. If we do not reach you, we will leave a message.  If you develop any symptoms (ie: fever, flu-like symptoms, shortness of breath, cough etc.) before then, please call [phone].  If you test positive for Covid 19 in the 2 weeks post procedure, please call and report this information to [place].    If any biopsies were taken you will be contacted by phone or by letter within the next 1-3 weeks.  Please call us at [phone] if you have not heard about the biopsies in 3 weeks.    SIGNATURES/CONFIDENTIALITY: You and/or your care partner have signed paperwork which will be entered into your electronic medical record.  These signatures  attest to the fact that that the information above on your After Visit Summary has been reviewed and is understood.  Full responsibility of the confidentiality of this discharge information lies with you and/or your care-partner.

## 2022-05-09 NOTE — Op Note (Signed)
Rachel Larson Patient Name: Rachel Larson Procedure Date: 05/09/2022 1:38 PM MRN: [id number] Endoscopist: Rachel Larson "Rachel Larson ,  Age: 61 Referring MD:  Date of Birth: [birth year] Gender: Female Account #: [id number] Procedure:                Upper GI endoscopy Indications:              Heartburn Medicines:                Monitored Anesthesia Care Procedure:                Pre-Anesthesia Assessment:                           - Prior to the procedure, a History and Physical                            was performed, and patient medications and                            allergies were reviewed. The patient's tolerance of                            previous anesthesia was also reviewed. The risks                            and benefits of the procedure and the sedation                            options and risks were discussed with the patient.                            All questions were answered, and informed consent                            was obtained. Prior Anticoagulants: The patient has                            taken no previous anticoagulant or antiplatelet                            agents. ASA Grade Assessment: II - A patient with                            mild systemic disease. After reviewing the risks                            and benefits, the patient was deemed in                            satisfactory condition to undergo the procedure.                           After obtaining informed consent, the endoscope was  passed under direct vision. Throughout the                            procedure, the patient's blood pressure, pulse, and                            oxygen saturations were monitored continuously. The                            GIF [id number] #[id number] was introduced through the                            mouth, and advanced to the second part of duodenum.                            The upper GI endoscopy was accomplished  without                            difficulty. The patient tolerated the procedure                            well. Scope In: Scope Out: Findings:                 Salmon-colored mucosa was present. The maximum                            longitudinal extent of these esophageal mucosal                            changes was 1 cm in length. Biopsies were taken                            with a cold forceps for histology.                           Five 3 to 6 mm sessile polyps with no bleeding and                            no stigmata of recent bleeding were found in the                            gastric fundus. These polyps were removed with a                            cold snare. Resection and retrieval were complete.                           Localized inflammation characterized by congestion                            (edema), erosions and erythema was found in the  gastric antrum. Biopsies were taken with a cold                            forceps for histology.                           An acquired benign-appearing, intrinsic stenosis                            was found in the second portion of the duodenum and                            was traversed with mild resistance. Biopsies were                            taken with a cold forceps for histology. Complications:            No immediate complications. Estimated Blood Loss:     Estimated blood loss was minimal. Impression:               - Salmon-colored mucosa suspicious for Barrett's                            esophagus. Biopsied.                           - Five gastric polyps. Resected and retrieved.                           - Gastritis. Biopsied.                           - Acquired duodenal stenosis. Biopsied. Recommendation:           - No ibuprofen, naproxen, or other non-steroidal                            anti-inflammatory drugs.                           - Use Protonix (pantoprazole) 40 mg  PO BID for 8                            weeks.                           - Await pathology results.                           - Perform a colonoscopy today. [address]Rachel Larson" Rachel Larson,  05/09/2022 2:20:52 PM

## 2022-05-10 ENCOUNTER — Telehealth: Payer: Self-pay | Admitting: *Deleted

## 2022-05-10 NOTE — Telephone Encounter (Signed)
  Follow up Call-     05/09/2022   12:50 PM  Call back number  Post procedure Call Back phone  # [phone]  Permission to leave phone message Yes     Patient questions:  Do you have a fever, pain , or abdominal swelling? No. Pain Score  0 *  Have you tolerated food without any problems? Yes.    Have you been able to return to your normal activities? Yes.    Do you have any questions about your discharge instructions: Diet   No. Medications  No. Follow up visit  No.  Do you have questions or concerns about your Care? No.  Actions: * If pain score is 4 or above: No action needed, pain <4.

## 2022-05-14 ENCOUNTER — Encounter: Payer: Self-pay | Admitting: Internal Medicine

## 2022-05-15 ENCOUNTER — Other Ambulatory Visit: Payer: Self-pay | Admitting: Family Medicine

## 2022-05-15 DIAGNOSIS — E7800 Pure hypercholesterolemia, unspecified: Secondary | ICD-10-CM

## 2022-05-16 ENCOUNTER — Other Ambulatory Visit: Payer: Self-pay | Admitting: Nurse Practitioner

## 2022-05-30 ENCOUNTER — Other Ambulatory Visit (HOSPITAL_COMMUNITY): Payer: Self-pay

## 2022-06-05 ENCOUNTER — Telehealth: Payer: Self-pay | Admitting: Pharmacy Technician

## 2022-06-05 ENCOUNTER — Other Ambulatory Visit (HOSPITAL_COMMUNITY): Payer: Self-pay

## 2022-06-05 NOTE — Telephone Encounter (Signed)
Patient Advocate Encounter  Prior Authorization for PANTOPRAZOLE '40MG'$  has been approved.    PA# [id number] Effective dates: 8.16.23 through 8.15.26  Debra Calabretta B. CPhT P: [phone] F: [phone]   Received notification from [employer] that prior authorization for PANTOPRAZOLE '40MG'$  is required.   PA submitted on 8.16.23 Key [id number] Status is pending    Luciano Cutter, CPhT Patient Advocate Phone: [phone]

## 2022-06-05 NOTE — Telephone Encounter (Signed)
PA has been submitted and has been approved. May be filled at pharmacy level.

## 2022-06-19 ENCOUNTER — Ambulatory Visit: Payer: No Typology Code available for payment source | Admitting: Family Medicine

## 2022-07-03 ENCOUNTER — Encounter: Payer: Self-pay | Admitting: Internal Medicine

## 2022-07-03 ENCOUNTER — Ambulatory Visit: Payer: No Typology Code available for payment source | Admitting: Internal Medicine

## 2022-07-03 VITALS — BP 120/60 | HR 62 | Ht 60.0 in | Wt 184.0 lb

## 2022-07-03 DIAGNOSIS — K219 Gastro-esophageal reflux disease without esophagitis: Secondary | ICD-10-CM

## 2022-07-03 DIAGNOSIS — K22719 Barrett's esophagus with dysplasia, unspecified: Secondary | ICD-10-CM | POA: Diagnosis not present

## 2022-07-03 DIAGNOSIS — K315 Obstruction of duodenum: Secondary | ICD-10-CM | POA: Diagnosis not present

## 2022-07-03 MED ORDER — PANTOPRAZOLE SODIUM 40 MG PO TBEC: 40.0000 mg | DELAYED_RELEASE_TABLET | Freq: Every day | ORAL | 3 refills | Status: DC

## 2022-07-03 NOTE — Patient Instructions (Signed)
We have sent the following medications to your pharmacy for you to pick up at your convenience: Pantoprazole  If you develop nausea or vomiting or abdominal pain we could consider earlier EGD for dilation of duodenal stricture.   Drink 8 cups of water a day and walk 30 minutes a day.   Due to recent changes in healthcare laws, you may see the results of your imaging and laboratory studies on MyChart before your provider has had a chance to review them.  We understand that in some cases there may be results that are confusing or concerning to you. Not all laboratory results come back in the same time frame and the provider may be waiting for multiple results in order to interpret others.  Please give [place] 48 hours in order for your provider to thoroughly review all the results before contacting the office for clarification of your results.    I appreciate the opportunity to care for you. Georgian Co, MD

## 2022-07-03 NOTE — Progress Notes (Signed)
07/03/2022 Rachel Larson [id number] 08/25/1961   CHIEF COMPLAINT: Barrett's esophagus, duodenal stricture  HISTORY OF PRESENT ILLNESS: Rachel Larson is a 61 year old female with history of hyperlipidemia, obesity, Barrett's esophagus, duodenal stricture presents for follow up of Barrett's esophagus and duodenal stricture  Interval History: She dropped down from BID dosing of her pantoprazole to QD last week. She has noticed a difference with some slight burning in her chest on the QD dosing. She would like to stay on the PPI QD dosing to see how she does on it. Denies N&V or ab pain. She is now avoiding ibuprofen use and using Tylenol instead. She is using Benefiber with improvement in her fecal incontinence. Denies rectal bleeding. She is having one BM once every 1.5 days, which is slightly less frequent than the 1 BM per day she was having in the past.     Latest Ref Rng & Units 11/20/2021    9:23 AM 01/16/2021    8:54 AM 01/14/2020    8:55 AM  CMP  Glucose 70 - 99 mg/dL 120  125  113   BUN 6 - 24 mg/dL '11  18  13   '$ Creatinine 0.57 - 1.00 mg/dL 0.80  0.86  0.80   Sodium 134 - 144 mmol/L 144  142  142   Potassium 3.5 - 5.2 mmol/L 4.2  4.1  4.3   Chloride 96 - 106 mmol/L 107  103  107   CO2 20 - 29 mmol/L '22  21  21   '$ Calcium 8.7 - 10.2 mg/dL 9.2  9.7  9.3   Total Protein 6.0 - 8.5 g/dL 6.9  7.3  6.7   Total Bilirubin 0.0 - 1.2 mg/dL 0.3  0.5  0.3   Alkaline Phos 44 - 121 IU/L 93  97  100   AST 0 - 40 IU/L '19  19  16   '$ ALT 0 - 32 IU/L '15  17  12       '$ Latest Ref Rng & Units 11/26/2018    4:42 PM 05/27/2018    4:27 PM 11/26/2017    4:33 PM  CBC  WBC 3.4 - 10.8 x10E3/uL 5.4  5.4  6.5   Hemoglobin 11.1 - 15.9 g/dL 12.3  12.7  13.3   Hematocrit 34.0 - 46.6 % 35.6  37.3  38.1   Platelets 150 - 450 x10E3/uL 181  192  194       Outpatient Encounter Medications as of 07/03/2022  Medication Sig   fluticasone (FLONASE) 50 MCG/ACT nasal spray Place 2 sprays into both nostrils  daily.   hydrocortisone (ANUSOL-HC) 2.5 % rectal cream Place 1 Application rectally 2 (two) times daily. For 7 days   pantoprazole (PROTONIX) 40 MG tablet Take 1 tablet (40 mg total) by mouth daily. Take 30 minutes before breakfast.   rosuvastatin (CRESTOR) 20 MG tablet TAKE 1 TABLET BY MOUTH EVERY DAY   tretinoin (RETIN-A) 0.05 % cream SMARTSIG:sparingly Topical Every Night PRN   VITAMIN D, CHOLECALCIFEROL, PO Take 1 tablet by mouth 2 (two) times a week.    [DISCONTINUED] pantoprazole (PROTONIX) 40 MG tablet Take 1 tablet (40 mg total) by mouth 2 (two) times daily. For 8 weeks   No facility-administered encounter medications on file as of 07/03/2022.   PHYSICAL EXAM: BP 120/60   Pulse 62   Ht 5' (1.524 m)   Wt 184 lb (83.5 kg)   BMI 35.94 kg/m   General: 61 year old female in  no acute distress. Head: Normocephalic and atraumatic. Eyes:  Sclerae non-icteric, conjunctive pink. Ears: Normal auditory acuity. Mouth: Dentition intact. No ulcers or lesions.  Neck: Supple, no lymphadenopathy or thyromegaly.  Lungs: Clear bilaterally to auscultation without wheezes, crackles or rhonchi. Heart: Regular rate and rhythm. No murmur, rub or gallop appreciated.  Abdomen: Soft, nontender, non distended. No masses. No hepatosplenomegaly. Normoactive bowel sounds x 4 quadrants.  Musculoskeletal: Symmetrical with no gross deformities. Skin: Warm and dry. No rash or lesions on visible extremities. Extremities: No edema. Neurological: Alert oriented x 4, no focal deficits.  Psychological:  Alert and cooperative. Normal mood and affect.  Colonoscopy by Dr. Olevia Perches 10/13/2013: Normal colonoscopy  Recall colonoscopy 10 years    EGD 05/09/22: - Salmon-colored mucosa suspicious for Barrett's esophagus. Biopsied. - Five gastric polyps. Resected and retrieved. - Gastritis. Biopsied. - Acquired duodenal stenosis. Biopsied. Path: 1. Surgical [P], duodenal - DUODENAL MUCOSA WITH PROMINENT BRUNNER'S GLANDS  AND FOCAL FOVEOLAR METAPLASIA CONSISTENT WITH CHRONIC PEPTIC DUODENITIS. 2. Surgical [P], gastric - PREDOMINANTLY OXYNTIC TYPE MUCOSA WITH NO SIGNIFICANT PATHOLOGY. - NO HELICOBACTER PYLORI ORGANISMS IDENTIFIED ON H&E STAINED SLIDE. 3. Surgical [P], gastric polyps, polyp (5) - FUNDIC GLAND POLYPS. 4. Surgical [P], esophagus - SQUAMOCOLUMNAR JUNCTIONAL MUCOSA WITH EXTENSIVE INTESTINAL METAPLASIA CONSISTENT WITH BARRETT'S ESOPHAGUS IN THE APPROPRIATE ENDOSCOPIC SETTING. - NEGATIVE FOR DYSPLASIA.  Colonoscopy 05/09/22: - The examined portion of the ileum was normal. - One 3 mm polyp in the sigmoid colon, removed with a cold snare. Resected and retrieved. - Diverticulosis in the sigmoid colon. - Non-bleeding internal hemorrhoids. Path: 5. Surgical [P], colon, sigmoid, polyp (1) - HYPERPLASTIC POLYP WITH PROLAPSE EFFECT.  ASSESSMENT AND PLAN:  GERD Barrett's esophagus Duodenal stricture Patient presents for follow up of GERD. On her recent EGD she was found to have Barrett's esophagus as well as a duodenal stricture. She will need to be on PPI QD indefinitely due to her history of Barrett's esophagus. I did discuss with the patient that due to her duodenal stricture, she may need dilation in the future if she were to ever develop symptom such as N&V or ab pain.  - Continue pantoprazole 40 mg QD - Continue Benefiber 1 tablespoon daily to avoid straining - Recall for EGD in 04/2025 for Barrett's surveillance.  - Recall for colonoscopy in 04/2032 for colon cancer screening - Avoid NSAIDs - If patient were to develop N&V or ab pain, then she can let us know and we can consider an earlier EGD for dilation of duodenal stricture. - Encourage drinking 8 cups of water per day and walking 30 min per day  [name], MD

## 2022-08-15 ENCOUNTER — Other Ambulatory Visit: Payer: Self-pay | Admitting: Family Medicine

## 2022-08-15 DIAGNOSIS — E7800 Pure hypercholesterolemia, unspecified: Secondary | ICD-10-CM

## 2022-08-29 ENCOUNTER — Other Ambulatory Visit: Payer: Self-pay | Admitting: Family Medicine

## 2022-08-29 DIAGNOSIS — E7800 Pure hypercholesterolemia, unspecified: Secondary | ICD-10-CM

## 2022-09-11 ENCOUNTER — Ambulatory Visit (INDEPENDENT_AMBULATORY_CARE_PROVIDER_SITE_OTHER): Payer: No Typology Code available for payment source

## 2022-09-11 ENCOUNTER — Encounter: Payer: Self-pay | Admitting: Family Medicine

## 2022-09-11 ENCOUNTER — Telehealth: Payer: Self-pay

## 2022-09-11 ENCOUNTER — Ambulatory Visit: Payer: No Typology Code available for payment source | Admitting: Family Medicine

## 2022-09-11 VITALS — BP 102/70 | HR 73 | Temp 98.2°F | Ht 60.0 in | Wt 183.2 lb

## 2022-09-11 DIAGNOSIS — R739 Hyperglycemia, unspecified: Secondary | ICD-10-CM

## 2022-09-11 DIAGNOSIS — K22719 Barrett's esophagus with dysplasia, unspecified: Secondary | ICD-10-CM

## 2022-09-11 DIAGNOSIS — M5442 Lumbago with sciatica, left side: Secondary | ICD-10-CM

## 2022-09-11 DIAGNOSIS — E7800 Pure hypercholesterolemia, unspecified: Secondary | ICD-10-CM | POA: Diagnosis not present

## 2022-09-11 DIAGNOSIS — G8929 Other chronic pain: Secondary | ICD-10-CM

## 2022-09-11 DIAGNOSIS — Z780 Asymptomatic menopausal state: Secondary | ICD-10-CM | POA: Diagnosis not present

## 2022-09-11 DIAGNOSIS — K529 Noninfective gastroenteritis and colitis, unspecified: Secondary | ICD-10-CM

## 2022-09-11 LAB — BAYER DCA HB A1C WAIVED: HB A1C (BAYER DCA - WAIVED): 5.6 % (ref 4.8–5.6)

## 2022-09-11 MED ORDER — ROSUVASTATIN CALCIUM 20 MG PO TABS: 20.0000 mg | ORAL_TABLET | Freq: Every day | ORAL | 3 refills | Status: DC

## 2022-09-11 MED ORDER — LIDOCAINE 5 % EX PTCH: 1.0000 | MEDICATED_PATCH | CUTANEOUS | 99 refills | Status: DC

## 2022-09-11 NOTE — Telephone Encounter (Signed)
Just let her know insurance will not cover rx patches but she can get the OTC version that is 4% if she wants to have that for when she is working all day.

## 2022-09-11 NOTE — Telephone Encounter (Signed)
Pt aware.

## 2022-09-11 NOTE — Telephone Encounter (Signed)
Lidocaine patches have been denied by patient's insurance.

## 2022-09-11 NOTE — Progress Notes (Signed)
Subjective: EF:EOFH, back pain PCP: Janora Norlander, DO QRF:Rachel Larson is a 61 y.o. female presenting to clinic today for:  1.  Low back pain Patient reports that she had an injury several years ago where she was hit with a shelving unit.  When she later went to lift weights she started having back pain in her left low back that radiated slightly into her left buttock and thigh.  She described this as a burning sensation.  She has not had repeat burning sensation when she constantly has some spasm and pain in that left low back, particularly since she started working at [employer], frequently bending and bagging things.  She uses Tylenol as she cannot use NSAIDs anymore due to some GI bleeding previously.  She notes that she has been trying to come off of her Protonix.  No recurrent bleeding or abdominal pain reported.  She denies any sensory changes in the lower extremity and the pain never radiates past mid thigh.  No associated weakness.  2. Diarrhea She reports struggling with diarrhea.  This tends to occur after she is eating a high-fat meal.  No blood in stool.   ROS: Per HPI  Allergies  Allergen Reactions   Sulfonamide Derivatives Hives and Itching   Past Medical History:  Diagnosis Date   Allergy    Baker cyst    rt knee   Barrett's esophagus    Chest pain    GERD (gastroesophageal reflux disease)    Hyperlipidemia    Reflux    Sleep apnea     Current Outpatient Medications:    fluticasone (FLONASE) 50 MCG/ACT nasal spray, Place 2 sprays into both nostrils daily., Disp: 48 g, Rfl: 1   hydrocortisone (ANUSOL-HC) 2.5 % rectal cream, Place 1 Application rectally 2 (two) times daily. For 7 days, Disp: 30 g, Rfl: 1   pantoprazole (PROTONIX) 40 MG tablet, Take 1 tablet (40 mg total) by mouth daily. Take 30 minutes before breakfast., Disp: 90 tablet, Rfl: 3   rosuvastatin (CRESTOR) 20 MG tablet, Take 1 tablet (20 mg total) by mouth daily., Disp: 90 tablet, Rfl: 0    VITAMIN D, CHOLECALCIFEROL, PO, Take 1 tablet by mouth 2 (two) times a week. , Disp: , Rfl:  Social History   Socioeconomic History   Marital status: Married    Spouse name: Not on file   Number of children: 2   Years of education: Not on file   Highest education level: Not on file  Occupational History   Occupation: Medical Records Clerk    Comment: [employer]  Tobacco Use   Smoking status: Former    Packs/day: 1.00    Years: 18.00    Total pack years: 18.00    Types: Cigarettes    Start date: 10/24/1977    Quit date: 07/09/1998    Years since quitting: 24.1   Smokeless tobacco: Never  Vaping Use   Vaping Use: Never used  Substance and Sexual Activity   Alcohol use: Yes    Comment: occasionally   Drug use: No   Sexual activity: Not on file  Other Topics Concern   Not on file  Social History Narrative   Lives with husband   Social Determinants of Health   Financial Resource Strain: Not on file  Food Insecurity: Not on file  Transportation Needs: Not on file  Physical Activity: Not on file  Stress: Not on file  Social Connections: Not on file  Intimate Partner Violence:  Not on file   Family History  Problem Relation Age of Onset   Diabetes Mother    Irritable bowel syndrome Mother    Other Father        Hx unknown   Hypertension Brother    Diabetes type II Brother    Hypertension Brother    Diabetes type II Brother    Heart failure Maternal Grandmother        Died age 93   Colon cancer Neg Hx    Stomach cancer Neg Hx    Esophageal cancer Neg Hx    Colon polyps Neg Hx    Rectal cancer Neg Hx     Objective: Office vital signs reviewed. BP 102/70   Pulse 73   Temp 98.2 F (36.8 C)   Ht 5' (1.524 m)   Wt 183 lb 3.2 oz (83.1 kg)   SpO2 91%   BMI 35.78 kg/m   Physical Examination:  General: Awake, alert, morbidly obese, No acute distress HEENT: sclera white, MMM.  No conjunctival pallor Cardio: regular rate and rhythm, S1S2 heard, no  murmurs appreciated Pulm: clear to auscultation bilaterally, no wheezes, rhonchi or rales; normal work of breathing on room air Extremities: warm, well perfused, No edema, cyanosis or clubbing; +2 pulses bilaterally MSK: Ambulating independently with normal gait and station.  She has tenderness palpation over the lumbosacral/SI area on the left side.  Negative straight leg raise Neuro: 5/5 Strength and light touch sensation grossly intact   Assessment/ Plan: 61 y.o. female   Pure hypercholesterolemia - Plan: CMP14+EGFR, Lipid panel, TSH, rosuvastatin (CRESTOR) 20 MG tablet, TSH, Lipid panel, CMP14+EGFR  Morbid obesity (HCC) - Plan: CMP14+EGFR, Bayer DCA Hb A1c Waived, TSH, TSH, CMP14+EGFR, Bayer DCA Hb A1c Waived  Barrett's esophagus with dysplasia - Plan: CBC, CBC  Elevated blood sugar - Plan: CMP14+EGFR, Bayer DCA Hb A1c Waived, CMP14+EGFR, Bayer DCA Hb A1c Waived  Asymptomatic postmenopausal estrogen deficiency - Plan: DG [facility] DEXA  Chronic midline low back pain with left-sided sciatica - Plan: DG Lumbar Spine 2-3 Views, Ambulatory referral to Orthopedic Surgery, lidocaine (LIDODERM) 5 %  Chronic diarrhea - Plan: Pancreatic Elastase, Fecal, Fecal fat, qualitative  Check fasting labs.  I reviewed her most recent appointment and recommendations by GI.  She is self weaning from the PPI.  Check CBC given history of previous gastritis and known Barrett's esophagus  DEXA scan ordered  X-ray obtained of low back given left-sided back pain that is chronic and not improving.  Lidoderm patches to the affected area.  Referral to orthopedic surgery per her request.  For her diarrhea that seems to be associated with fatty meals I have ordered pancreatic elastase to further evaluate for possible pancreatic insufficiency.  If positive will refer back to GI. In the interim, recommended reduction in fatty foods.  Orders Placed This Encounter  Procedures   DG [facility] DEXA    Standing Status:    Future    Number of Occurrences:   1    Standing Expiration Date:   09/12/2023    Order Specific Question:   Reason for Exam (SYMPTOM  OR DIAGNOSIS REQUIRED)    Answer:   screen osteoporosis    Order Specific Question:   Is the patient pregnant?    Answer:   No   DG Lumbar Spine 2-3 Views    Standing Status:   Future    Number of Occurrences:   1    Standing Expiration Date:   03/12/2023  Order Specific Question:   Reason for Exam (SYMPTOM  OR DIAGNOSIS REQUIRED)    Answer:   left low back pain    Order Specific Question:   Is the patient pregnant?    Answer:   No    Order Specific Question:   Preferred imaging location?    Answer:   Internal   CMP14+EGFR    Standing Status:   Future    Number of Occurrences:   1    Standing Expiration Date:   09/12/2023   Lipid panel    Standing Status:   Future    Number of Occurrences:   1    Standing Expiration Date:   09/12/2023   Bayer DCA Hb A1c Waived    Standing Status:   Future    Number of Occurrences:   1    Standing Expiration Date:   09/12/2023   TSH    Standing Status:   Future    Number of Occurrences:   1    Standing Expiration Date:   09/12/2023   CBC    Standing Status:   Future    Number of Occurrences:   1    Standing Expiration Date:   09/12/2023   Pancreatic Elastase, Fecal    Standing Status:   Future    Standing Expiration Date:   09/12/2023   Fecal fat, qualitative    Standing Status:   Future    Standing Expiration Date:   09/12/2023   Ambulatory referral to Orthopedic Surgery    Referral Priority:   Routine    Referral Type:   Surgical    Referral Reason:   Specialty Services Required    Requested Specialty:   Orthopedic Surgery    Number of Visits Requested:   1   No orders of the defined types were placed in this encounter.    Janora Norlander, DO [place] [phone]

## 2022-09-11 NOTE — Telephone Encounter (Signed)
Shekelia Burling Key: [id number] - [place] Case ID: [id number] - Rx #: [id number] Need help? Call us at [phone] Status Sent to [facility] Lidocaine 5% patches Form [employer] Electronic [place] Form [phone] NCPDP) Original Claim Info [place] REQ-MD CALL [phone].DRUG REQUIRES PRIOR AUTHORIZATION(PHARMACY HELP DESK [phone])

## 2022-09-11 NOTE — Patient Instructions (Signed)
Lumbar Strain A lumbar strain, which is sometimes called a low-back strain, is a stretch or tear in a muscle or tendons in the lower back (lumbar spine). Tendons are the strong cords of tissue that attach muscle to bone. This type of injury occurs when muscles or tendons are torn or are stretched beyond their limits. Lumbar strains can range from mild to severe. Mild strains may involve stretching a muscle or tendon without tearing it. These may heal in 1-2 weeks. More severe strains involve tearing of muscle fibers or tendons. These will cause more pain and may take 6-8 weeks to heal. What are the causes? This condition may be caused by: Trauma, such as a fall or a hit to the body. Twisting or overstretching the back. This may result from doing activities that take a lot of energy, such as lifting heavy objects. What increases the risk? A lumbar strain is more common in: Athletes. People with obesity. People who do repeated lifting, bending, or other movements that involve their back. What are the signs or symptoms? Symptoms of this condition may include: Sharp or dull pain in the lower back that does not go away. The pain may spread to the buttocks. Stiffness or limited range of motion. Sudden muscle tightening (spasms). How is this diagnosed? This condition may be diagnosed based on: Your symptoms. Your medical history. A physical exam. Imaging tests, such as: X-rays. MRI. How is this treated? Treatment for this condition may include: Rest. Applying heat and cold to the affected area. Over-the-counter medicines to help with pain and inflammation, such as NSAIDs. Prescription medicine for pain or to relax the muscles. These may be needed for a short time. Physical therapy exercises to improve movement and strength. Follow these instructions at home: Managing pain, stiffness, and swelling     If told, put ice on the injured area during the first 24 hours after your injury. Put  ice in a plastic bag. Place a towel between your skin and the bag. Leave the ice on for 20 minutes, 2-3 times a day. If told, apply heat to the affected area as often as told by your health care provider. Use the heat source that your health care provider recommends, such as a moist heat pack or a heating pad. Place a towel between your skin and the heat source. Leave the heat on for 20-30 minutes. If your skin turns bright red, remove the ice or heat right away to prevent skin damage. The risk of damage is higher if you cannot feel pain, heat, or cold. Activity Rest and return to your normal activities as told by your health care provider. Ask your health care provider what activities are safe for you. Do exercises as told by your health care provider. General instructions Take over-the-counter and prescription medicines only as told by your health care provider. Ask your health care provider if the medicine prescribed to you: Requires you to avoid driving or using machinery. Can cause constipation. You may need to take these actions to prevent or treat constipation: Drink enough fluid to keep your urine pale yellow. Take over-the-counter or prescription medicines. Eat foods that are high in fiber, such as beans, whole grains, and fresh fruits and vegetables. Limit foods that are high in fat and processed sugars, such as fried or sweet foods. Do not use any products that contain nicotine or tobacco. These products include cigarettes, chewing tobacco, and vaping devices, such as e-cigarettes. If you need help quitting, ask your  health care provider. How is this prevented? To prevent a future low-back injury: Always warm up properly before physical activity or sports. Cool down and stretch after being active. Use correct form when playing sports and lifting heavy objects. Bend your knees before you lift heavy objects. Use good posture when sitting and standing. Stay physically fit and keep a  healthy weight. Do at least 150 minutes of moderate intensity exercise each week, such as brisk walking or water aerobics. Do strength exercises at least 2 times each week. Contact a health care provider if: Your back pain does not improve after several weeks of treatment. Your symptoms get worse. You have a fever. Get help right away if: Your back pain is severe. You are unable to stand or walk. You develop pain in your legs. You have weakness in your buttocks or legs. You have trouble controlling when you urinate or when you have a bowel movement. You have frequent, painful, or bloody urination. This information is not intended to replace advice given to you by your health care provider. Make sure you discuss any questions you have with your health care provider. Document Revised: 04/30/2022 Document Reviewed: 04/30/2022 Elsevier Patient Education  [place].

## 2022-09-12 LAB — CMP14+EGFR
ALT: 13 IU/L (ref 0–32)
AST: 15 IU/L (ref 0–40)
Albumin/Globulin Ratio: 1.9 (ref 1.2–2.2)
Albumin: 4.5 g/dL (ref 3.8–4.9)
Alkaline Phosphatase: 99 IU/L (ref 44–121)
BUN/Creatinine Ratio: 18 (ref 9–23)
BUN: 15 mg/dL (ref 6–24)
Bilirubin Total: 0.5 mg/dL (ref 0.0–1.2)
CO2: 23 mmol/L (ref 20–29)
Calcium: 9 mg/dL (ref 8.7–10.2)
Chloride: 107 mmol/L — ABNORMAL HIGH (ref 96–106)
Creatinine, Ser: 0.83 mg/dL (ref 0.57–1.00)
Globulin, Total: 2.4 g/dL (ref 1.5–4.5)
Glucose: 115 mg/dL — ABNORMAL HIGH (ref 70–99)
Potassium: 4.8 mmol/L (ref 3.5–5.2)
Sodium: 143 mmol/L (ref 134–144)
Total Protein: 6.9 g/dL (ref 6.0–8.5)
eGFR: 81 mL/min/{1.73_m2} (ref >59)

## 2022-09-12 LAB — CBC
Hematocrit: 38.4 % (ref 34.0–46.6)
Hemoglobin: 12.8 g/dL (ref 11.1–15.9)
MCH: 31.2 pg (ref 26.6–33.0)
MCHC: 33.3 g/dL (ref 31.5–35.7)
MCV: 94 fL (ref 79–97)
Platelets: 167 10*3/uL (ref 150–450)
RBC: 4.1 x10E6/uL (ref 3.77–5.28)
RDW: 12.7 % (ref 11.7–15.4)
WBC: 4 10*3/uL (ref 3.4–10.8)

## 2022-09-12 LAB — TSH: TSH: 1.63 u[IU]/mL (ref 0.450–4.500)

## 2022-09-12 LAB — LIPID PANEL
Chol/HDL Ratio: 2.8 ratio (ref 0.0–4.4)
Cholesterol, Total: 142 mg/dL (ref 100–199)
HDL: 51 mg/dL (ref >39)
LDL Chol Calc (NIH): 71 mg/dL (ref 0–99)
Triglycerides: 110 mg/dL (ref 0–149)
VLDL Cholesterol Cal: 20 mg/dL (ref 5–40)

## 2022-09-16 NOTE — Progress Notes (Signed)
Patient returning call. Please call back

## 2022-09-26 ENCOUNTER — Ambulatory Visit: Payer: No Typology Code available for payment source | Admitting: Orthopaedic Surgery

## 2022-10-24 ENCOUNTER — Encounter: Payer: Self-pay | Admitting: Family Medicine

## 2022-11-07 ENCOUNTER — Encounter: Payer: Self-pay | Admitting: Orthopaedic Surgery

## 2022-11-07 ENCOUNTER — Ambulatory Visit: Payer: No Typology Code available for payment source | Admitting: Orthopaedic Surgery

## 2022-11-07 VITALS — Ht 60.0 in | Wt 185.0 lb

## 2022-11-07 DIAGNOSIS — G8929 Other chronic pain: Secondary | ICD-10-CM

## 2022-11-07 DIAGNOSIS — M7062 Trochanteric bursitis, left hip: Secondary | ICD-10-CM | POA: Insufficient documentation

## 2022-11-07 DIAGNOSIS — M545 Low back pain, unspecified: Secondary | ICD-10-CM | POA: Diagnosis not present

## 2022-11-07 MED ORDER — LIDOCAINE HCL 1 % IJ SOLN
1.0000 mL | INTRAMUSCULAR | Status: AC | PRN
  Administered 2022-11-07: 1 mL

## 2022-11-07 MED ORDER — BUPIVACAINE HCL 0.25 % IJ SOLN
2.0000 mL | INTRAMUSCULAR | Status: AC | PRN
  Administered 2022-11-07: 2 mL via INTRA_ARTICULAR

## 2022-11-07 MED ORDER — METHYLPREDNISOLONE ACETATE 40 MG/ML IJ SUSP
40.0000 mg | INTRAMUSCULAR | Status: AC | PRN
  Administered 2022-11-07: 40 mg via INTRA_ARTICULAR

## 2022-11-07 NOTE — Progress Notes (Signed)
Office Visit Note   Patient: Rachel Larson           Date of Birth: 03/22/1961           MRN: [id number] Visit Date: 11/07/2022              Requested by: Janora Norlander, DO [address] [place],  [place] [address] PCP: Janora Norlander, DO   Assessment & Plan: Visit Diagnoses:  1. Trochanteric bursitis, left hip   2. Chronic left-sided low back pain, unspecified whether sciatica present     Plan: Left trochanteric injection performed with improvement in her symptoms.  Will set up for some physical therapy and recheck her in 7 weeks.  Follow-Up Instructions: No follow-ups on file.   Orders:  No orders of the defined types were placed in this encounter.  No orders of the defined types were placed in this encounter.     Procedures: Large Joint Inj: L greater trochanter on 11/07/2022 3:54 PM Details: 22 G 1.5 in needle, lateral approach  Arthrogram: No  Medications: 1 mL lidocaine 1 %; 2 mL bupivacaine 0.25 %; 40 mg methylPREDNISolone acetate 40 MG/ML      Clinical Data: No additional findings.   Subjective: Chief Complaint  Patient presents with   Left Leg - Numbness   Lower Back - Pain    HPI 62 year old female seen with back and leg pain.  States she was doing some weight lifting in the gym 2018 felt a pop or pull in her back.  She has had persistent pain in her but left buttocks over SI joint.  Previous x-rays she had 2 falls 11/01/1721 1 11/03/2022 she is not sure which related to her back but had increased pain in her knee after the fall with some soreness.  She had osteopenia on the DEXA scan.  She points laterally over the trochanter where she is having some pain.  BMI is 36.  Former smoker with slight calcification noted on plain radiographs in the order.  Review of Systems all systems noncontributory HPI.   Objective: Vital Signs: Ht 5' (1.524 m)   Wt 185 lb (83.9 kg)   BMI 36.13 kg/m   Physical Exam Constitutional:      Appearance: She is  well-developed.  HENT:     Head: Normocephalic.     Right Ear: External ear normal.     Left Ear: External ear normal. There is no impacted cerumen.  Eyes:     Pupils: Pupils are equal, round, and reactive to light.  Neck:     Thyroid: No thyromegaly.     Trachea: No tracheal deviation.  Cardiovascular:     Rate and Rhythm: Normal rate.  Pulmonary:     Effort: Pulmonary effort is normal.  Abdominal:     Palpations: Abdomen is soft.  Musculoskeletal:     Cervical back: No rigidity.  Skin:    General: Skin is warm and dry.  Neurological:     Mental Status: She is alert and oriented to person, place, and time.  Psychiatric:        Behavior: Behavior normal.     Ortho Exam patient has mild sciatic notch tenderness on the left normal on the right.  Trochanteric bursal tenderness on the left.  Negative FABER test negative internal/external rotation of the hips.  Knee and ankle jerk are intact.  Distal pulses are normal.  Specialty Comments:  No specialty comments available.  Imaging: No results found.  PMFS History: Patient Active Problem List   Diagnosis Date Noted   Trochanteric bursitis, left hip 11/07/2022   Low back pain 11/07/2022   Barrett's esophagus with dysplasia 09/11/2022   Morbid obesity ([place]) 09/11/2022   Elevated blood sugar 09/11/2022   Cough 07/31/2020   OSA on CPAP 05/20/2019   Former smoker 05/20/2019   Dyspnea on exertion 05/20/2019   BMI 35.0-35.9,adult 03/12/2016   Allergic rhinitis due to pollen 03/12/2016   Osteopenia 20/35/5974   Metabolic syndrome 16/38/4536   Hyperlipidemia 01/31/2014   Allergic rhinitis 07/22/2013   GERD (gastroesophageal reflux disease) 07/22/2013   Chest pain 06/26/2011   Overweight(278.02) 06/26/2011   Past Medical History:  Diagnosis Date   Allergy    Baker cyst    rt knee   Barrett's esophagus    Chest pain    GERD (gastroesophageal reflux disease)    Hyperlipidemia    Reflux    Sleep apnea     Family  History  Problem Relation Age of Onset   Diabetes Mother    Irritable bowel syndrome Mother    Other Father        Hx unknown   Hypertension Brother    Diabetes type II Brother    Hypertension Brother    Diabetes type II Brother    Heart failure Maternal Grandmother        Died age 12   Colon cancer Neg Hx    Stomach cancer Neg Hx    Esophageal cancer Neg Hx    Colon polyps Neg Hx    Rectal cancer Neg Hx     Past Surgical History:  Procedure Laterality Date   CHOLECYSTECTOMY  2009   COLONOSCOPY     ESOPHAGOGASTRODUODENOSCOPY     NASAL SINUS SURGERY     TUBAL LIGATION     Social History   Occupational History   Occupation: Medical Records Clerk    Comment: [employer]  Tobacco Use   Smoking status: Former    Packs/day: 1.00    Years: 18.00    Total pack years: 18.00    Types: Cigarettes    Start date: 10/24/1977    Quit date: 07/09/1998    Years since quitting: 24.3   Smokeless tobacco: Never  Vaping Use   Vaping Use: Never used  Substance and Sexual Activity   Alcohol use: Yes    Comment: occasionally   Drug use: No   Sexual activity: Not on file

## 2022-12-13 ENCOUNTER — Telehealth: Payer: No Typology Code available for payment source | Admitting: Family

## 2022-12-17 ENCOUNTER — Ambulatory Visit: Payer: No Typology Code available for payment source | Admitting: Cardiology

## 2022-12-19 ENCOUNTER — Ambulatory Visit: Payer: No Typology Code available for payment source | Admitting: Orthopaedic Surgery

## 2022-12-30 ENCOUNTER — Telehealth: Payer: No Typology Code available for payment source | Admitting: Family Medicine

## 2022-12-30 ENCOUNTER — Ambulatory Visit: Payer: No Typology Code available for payment source | Admitting: Family

## 2022-12-30 ENCOUNTER — Encounter: Payer: Self-pay | Admitting: Family

## 2022-12-30 VITALS — BP 112/72 | HR 71 | Temp 97.6°F | Ht 60.0 in | Wt 190.2 lb

## 2022-12-30 DIAGNOSIS — J209 Acute bronchitis, unspecified: Secondary | ICD-10-CM | POA: Diagnosis not present

## 2022-12-30 MED ORDER — PREDNISONE 10 MG (21) PO TBPK: ORAL_TABLET | ORAL | 0 refills | Status: DC

## 2022-12-30 MED ORDER — PROMETHAZINE-DM 6.25-15 MG/5ML PO SYRP: 5.0000 mL | ORAL_SOLUTION | Freq: Three times a day (TID) | ORAL | 0 refills | Status: DC | PRN

## 2022-12-30 NOTE — Progress Notes (Signed)
Subjective:    Patient ID: Rachel Larson, female    DOB: 1960-12-01, 62 y.o.   MRN: [id number]  Chief Complaint  Patient presents with   Cough    STARTED 2/16. HAS HAD 2 AT HOME COVID TEST - HAS NOT HAD CHEST XRAY    Nasal Congestion   Shortness of Breath   PT presents to the office today with a cough that started 12/06/22. Has had two negative COVID tests at home.   She completed zpak and tessalon on 12/20/22. States she feels much better, but continues to have cough that is worse at night.  Cough This is a new problem. The current episode started 1 to 4 weeks ago. The problem has been gradually improving. The problem occurs constantly. The cough is Non-productive. Associated symptoms include ear congestion (right), headaches, rhinorrhea, a sore throat (scratchy), shortness of breath and wheezing. Pertinent negatives include no chills, ear pain, fever or myalgias. She has tried rest and OTC cough suppressant (zpak) for the symptoms.  Shortness of Breath Associated symptoms include headaches, rhinorrhea, a sore throat (scratchy) and wheezing. Pertinent negatives include no ear pain or fever.      Review of Systems  Constitutional:  Negative for chills and fever.  HENT:  Positive for rhinorrhea and sore throat (scratchy). Negative for ear pain.   Respiratory:  Positive for cough, shortness of breath and wheezing.   Musculoskeletal:  Negative for myalgias.  Neurological:  Positive for headaches.  All other systems reviewed and are negative.      Objective:   Physical Exam Vitals reviewed.  Constitutional:      General: She is not in acute distress.    Appearance: She is well-developed.  HENT:     Head: Normocephalic and atraumatic.     Right Ear: External ear normal.  Eyes:     Pupils: Pupils are equal, round, and reactive to light.  Neck:     Thyroid: No thyromegaly.  Cardiovascular:     Rate and Rhythm: Normal rate and regular rhythm.     Heart sounds: Normal heart  sounds. No murmur heard. Pulmonary:     Effort: Pulmonary effort is normal. No respiratory distress.     Breath sounds: Normal breath sounds. No wheezing.     Comments: Dry nonproductive cough  Abdominal:     General: Bowel sounds are normal. There is no distension.     Palpations: Abdomen is soft.     Tenderness: There is no abdominal tenderness.  Musculoskeletal:        General: No tenderness. Normal range of motion.     Cervical back: Normal range of motion and neck supple.  Skin:    General: Skin is warm and dry.  Neurological:     Mental Status: She is alert and oriented to person, place, and time.     Cranial Nerves: No cranial nerve deficit.     Deep Tendon Reflexes: Reflexes are normal and symmetric.  Psychiatric:        Behavior: Behavior normal.        Thought Content: Thought content normal.        Judgment: Judgment normal.    BP 112/72   Pulse 71   Temp 97.6 F (36.4 C) (Temporal)   Ht 5' (1.524 m)   Wt 190 lb 3.2 oz (86.3 kg)   SpO2 94%   BMI 37.15 kg/m         Assessment & Plan:   [name]  comes in today with chief complaint of Cough (STARTED 2/16. HAS HAD 2 AT HOME COVID TEST - HAS NOT HAD CHEST XRAY ), Nasal Congestion, and Shortness of Breath   Diagnosis and orders addressed:  1. Acute bronchitis, unspecified organism - Take meds as prescribed - Use a cool mist humidifier  -Use saline nose sprays frequently -Force fluids -For any cough or congestion  Use plain Mucinex- regular strength or max strength is fine -For fever or aces or pains- take tylenol or ibuprofen. -Throat lozenges if help -Follow up if symptoms worsen or do not improve  - predniSONE (STERAPRED UNI-PAK 21 TAB) 10 MG (21) TBPK tablet; Use as directed  Dispense: 21 tablet; Refill: 0 - promethazine-dextromethorphan (PROMETHAZINE-DM) 6.25-15 MG/5ML syrup; Take 5 mLs by mouth 3 (three) times daily as needed for cough.  Dispense: 180 mL; Refill: 0   Evelina Dun,  FNP

## 2022-12-30 NOTE — Patient Instructions (Signed)

## 2022-12-30 NOTE — Progress Notes (Unsigned)
Cardiology Office Note   Date:  01/01/2023   ID:  Rachel Larson, DOB 07-08-1961, MRN [id number]  PCP:  Janora Norlander, DO  Cardiologist:   Minus Breeding, MD   Chief Complaint  Patient presents with   Shortness of Breath   Chest Pain      History of Present Illness: Rachel Larson is a 62 y.o. female who presents for who presents for evaluation of OSA recently started on CPAP, former smoker, and hyperlipidemia.   I have seen her for chest pain. POET (Plain Old Exercise Treadmill) in 2018 did not reveal evidence of ischemia.   She was seen again in July with chest pain.   She had a coronary CT and had no CAD or calcium.    She was last seen in November 2021. She had DOE that was not thought to be related to primary lung or cardiac disease.  No further testing was done at that time.    She had an episode of chest discomfort a couple of months ago while driving down the road.  She said her chest felt heavy and her jaw was uncomfortable when she had some left arm tingling.  She felt short of breath.  Her chest was fluttering.  However, she has not had any recurrence of this.  She is able to walk her dog.  She does up an incline and does not get chest pain neck or arm pain.  She does get short of breath but this has been chronic.  It is unchanged from previous.  She is not having any new PND or orthopnea.  She has not having any new edema.  She was concerned because her recent spine film suggested some aortic atherosclerosis which had previously been identified.   Past Medical History:  Diagnosis Date   Baker cyst    rt knee   Barrett's esophagus    GERD (gastroesophageal reflux disease)    Hyperlipidemia    Reflux    Sleep apnea     Past Surgical History:  Procedure Laterality Date   CHOLECYSTECTOMY  2009   COLONOSCOPY     ESOPHAGOGASTRODUODENOSCOPY     NASAL SINUS SURGERY     TUBAL LIGATION       Current Outpatient Medications  Medication Sig Dispense Refill    fluticasone (FLONASE) 50 MCG/ACT nasal spray Place 2 sprays into both nostrils daily. 48 g 1   pantoprazole (PROTONIX) 40 MG tablet Take 1 tablet (40 mg total) by mouth daily. Take 30 minutes before breakfast. 90 tablet 3   predniSONE (STERAPRED UNI-PAK 21 TAB) 10 MG (21) TBPK tablet Use as directed 21 tablet 0   promethazine-dextromethorphan (PROMETHAZINE-DM) 6.25-15 MG/5ML syrup Take 5 mLs by mouth 3 (three) times daily as needed for cough. 180 mL 0   rosuvastatin (CRESTOR) 20 MG tablet Take 1 tablet (20 mg total) by mouth daily. 90 tablet 3   VITAMIN D, CHOLECALCIFEROL, PO Take 1 tablet by mouth 2 (two) times a week.      No current facility-administered medications for this visit.    Allergies:   Sulfonamide derivatives    ROS:  Please see the history of present illness.   Otherwise, review of systems are positive for none.   All other systems are reviewed and negative.    PHYSICAL EXAM: VS:  BP 138/86   Pulse 68   Ht 5' (1.524 m)   Wt 190 lb (86.2 kg)   BMI 37.11 kg/m  ,  BMI Body mass index is 37.11 kg/m. GENERAL:  Well appearing NECK:  No jugular venous distention, waveform within normal limits, carotid upstroke brisk and symmetric, no bruits, no thyromegaly LYMPHATICS:  No cervical, inguinal adenopathy CHEST:  Unremarkable HEART:  PMI not displaced or sustained,S1 and S2 within normal limits, no S3, no S4, no clicks, no rubs, no murmurs ABD:  Flat, positive bowel sounds normal in frequency in pitch, no bruits, no rebound, no guarding, no midline pulsatile mass, no hepatomegaly, no splenomegaly EXT:  2 plus pulses throughout, no edema, no cyanosis no clubbing SKIN:  No rashes no nodules NEURO:  Cranial nerves II through XII grossly intact, motor grossly intact throughout PSYCH:  Cognitively intact, oriented to person place and time    EKG:  EKG is ordered today. The ekg ordered today demonstrates sinus rhythm, rate 68, axis within normal limits, intervals within normal  limits, no acute ST-T wave changes.   Recent Labs: 09/11/2022: ALT 13; BUN 15; Creatinine, Ser 0.83; Hemoglobin 12.8; Platelets 167; Potassium 4.8; Sodium 143; TSH 1.630    Lipid Panel    Component Value Date/Time   CHOL 142 09/11/2022 1056   TRIG 110 09/11/2022 1056   TRIG 133 09/09/2016 0932   HDL 51 09/11/2022 1056   HDL 54 09/09/2016 0932   CHOLHDL 2.8 09/11/2022 1056   LDLCALC 71 09/11/2022 1056   LDLCALC 92 07/29/2014 1057      Wt Readings from Last 3 Encounters:  01/01/23 190 lb (86.2 kg)  12/30/22 190 lb 3.2 oz (86.3 kg)  11/07/22 185 lb (83.9 kg)      Other studies Reviewed: Additional studies/ records that were reviewed today include: Labs. Review of the above records demonstrates:  Please see elsewhere in the note.     ASSESSMENT AND PLAN:   CHEST PAIN:   The patient's discomfort sounds predominantly nonanginal.  Happened 1 time 2 months ago.  She had a negative workup in the past as described above.  I think the pretest probability of obstructive coronary disease is low.  No further cardiovascular testing is suggested but she will have continued primary risk reduction.    Hyperlipidemia LDL was 71 with an HDL of 51.  No change in therapy.  Given the aortic atherosclerosis I would continue with diet and exercise and we did talk about this.  OSA  She is unable to use CPAP.  No change in therapy.   CAROTID:  There was no evidence of stenosis on Doppler in Nov 10/21.  No further workup.   Current medicines are reviewed at length with the patient today.  The patient does not have concerns regarding medicines.  The following changes have been made:  no change  Labs/ tests ordered today include:   Orders Placed This Encounter  Procedures   EKG 12-Lead     Disposition:   FU with me as needed.     Signed, Minus Breeding, MD  01/01/2023 10:52 AM    [place]

## 2023-01-01 ENCOUNTER — Ambulatory Visit: Payer: No Typology Code available for payment source | Admitting: Cardiology

## 2023-01-01 ENCOUNTER — Encounter: Payer: Self-pay | Admitting: Cardiology

## 2023-01-01 VITALS — BP 138/86 | HR 68 | Ht 60.0 in | Wt 190.0 lb

## 2023-01-01 DIAGNOSIS — E785 Hyperlipidemia, unspecified: Secondary | ICD-10-CM

## 2023-01-01 DIAGNOSIS — R0602 Shortness of breath: Secondary | ICD-10-CM

## 2023-01-01 NOTE — Patient Instructions (Signed)
Medication Instructions:  The current medical regimen is effective;  continue present plan and medications.  *If you need a refill on your cardiac medications before your next appointment, please call your pharmacy*  Follow-Up: At  [facility], you and your health needs are our priority.  As part of our continuing mission to provide you with exceptional heart care, we have created designated Provider Care Teams.  These Care Teams include your primary Cardiologist (physician) and Advanced Practice Providers (APPs -  Physician Assistants and Nurse Practitioners) who all work together to provide you with the care you need, when you need it.  We recommend signing up for the patient portal called "MyChart".  Sign up information is provided on this After Visit Summary.  MyChart is used to connect with patients for Virtual Visits (Telemedicine).  Patients are able to view lab/test results, encounter notes, upcoming appointments, etc.  Non-urgent messages can be sent to your provider as well.   To learn more about what you can do with MyChart, go to [email or web address].    Your next appointment:   Follow up as needed with Dr Hochrein  

## 2023-02-05 ENCOUNTER — Ambulatory Visit: Payer: No Typology Code available for payment source | Admitting: Cardiology

## 2023-02-26 ENCOUNTER — Ambulatory Visit: Payer: No Typology Code available for payment source | Admitting: Cardiology

## 2023-03-13 ENCOUNTER — Telehealth: Payer: No Typology Code available for payment source

## 2023-08-22 MED ORDER — PANTOPRAZOLE SODIUM 40 MG PO TBEC: 40.0000 mg | DELAYED_RELEASE_TABLET | Freq: Every day | ORAL | 3 refills | Status: DC

## 2023-09-17 ENCOUNTER — Encounter: Payer: Self-pay | Admitting: Family Medicine

## 2023-09-17 ENCOUNTER — Ambulatory Visit (INDEPENDENT_AMBULATORY_CARE_PROVIDER_SITE_OTHER): Payer: No Typology Code available for payment source | Admitting: Family Medicine

## 2023-09-17 VITALS — BP 120/75 | HR 68 | Temp 98.4°F | Ht 60.0 in | Wt 189.0 lb

## 2023-09-17 DIAGNOSIS — Z114 Encounter for screening for human immunodeficiency virus [HIV]: Secondary | ICD-10-CM | POA: Diagnosis not present

## 2023-09-17 DIAGNOSIS — Z23 Encounter for immunization: Secondary | ICD-10-CM | POA: Diagnosis not present

## 2023-09-17 DIAGNOSIS — G4733 Obstructive sleep apnea (adult) (pediatric): Secondary | ICD-10-CM

## 2023-09-17 DIAGNOSIS — E7800 Pure hypercholesterolemia, unspecified: Secondary | ICD-10-CM

## 2023-09-17 DIAGNOSIS — Z638 Other specified problems related to primary support group: Secondary | ICD-10-CM

## 2023-09-17 DIAGNOSIS — M85852 Other specified disorders of bone density and structure, left thigh: Secondary | ICD-10-CM | POA: Diagnosis not present

## 2023-09-17 DIAGNOSIS — Z0001 Encounter for general adult medical examination with abnormal findings: Secondary | ICD-10-CM | POA: Diagnosis not present

## 2023-09-17 DIAGNOSIS — K22719 Barrett's esophagus with dysplasia, unspecified: Secondary | ICD-10-CM | POA: Diagnosis not present

## 2023-09-17 DIAGNOSIS — Z813 Family history of other psychoactive substance abuse and dependence: Secondary | ICD-10-CM

## 2023-09-17 DIAGNOSIS — Z0000 Encounter for general adult medical examination without abnormal findings: Secondary | ICD-10-CM

## 2023-09-17 LAB — BAYER DCA HB A1C WAIVED: HB A1C (BAYER DCA - WAIVED): 5.6 % (ref 4.8–5.6)

## 2023-09-17 MED ORDER — ROSUVASTATIN CALCIUM 20 MG PO TABS: 20.0000 mg | ORAL_TABLET | Freq: Every day | ORAL | 3 refills | Status: DC

## 2023-09-17 MED ORDER — ESOMEPRAZOLE MAGNESIUM 40 MG PO CPDR: 40.0000 mg | DELAYED_RELEASE_CAPSULE | Freq: Every day | ORAL | 3 refills | Status: DC

## 2023-09-17 NOTE — Addendum Note (Signed)
Addended by: Waynette Buttery on: 09/17/2023 10:46 AM   Modules accepted: Orders

## 2023-09-17 NOTE — Progress Notes (Signed)
Rachel Larson is a 62 y.o. female presents to office today for annual physical exam examination.    Concerns today include: 1.  Weight/reactive depression She notes that she is needing to lose weight.  She admits that she snacks quite a bit especially after 7 PM.  She has had increase stressors related to her son, Rachel Larson, who suffers from addiction and sustained severe injuries in his 31s as a result of an MVA.  She struggles with his poor decision making and notes that this weighs on her spirit quite a bit.  She prays a lot about it and tries to help him when she can.  She is not interested in starting any antidepressants but would be amenable to seeing a counselor.  2.  Anemia She went to donate blood and was told that she had a hemoglobin of 11.7 and they would not allow her to any blood.  She denies any bleeding in stool.  No vaginal bleeding.  No changes in diet.  She is on a PPI for Barrett's esophagus but notes that she even still has breakthrough GI symptoms with the PPI.  She would like to switch to something else.  Previously on Nexium which was helpful but it was discontinued due to osteopenia.  She was not aware that all PPIs get into's osteopenia.  Marital status: married, Substance use: none Health Maintenance Due  Topic Date Due   HIV Screening  Never done   Zoster Vaccines- Shingrix (2 of 2) 01/15/2022   Refills needed today: all  Immunization History  Administered Date(s) Administered   Hepatitis B, ADULT 07/01/2014, 08/04/2014, 01/02/2015   Influenza,inj,Quad PF,6+ Mos 07/22/2013, 08/02/2014, 07/02/2019   Influenza-Unspecified 07/19/2021   MMR 11/14/2006   Moderna Sars-Covid-2 Vaccination 01/16/2021, 02/13/2021   PPD Test 01/31/2014, 07/02/2019   Pneumococcal Conjugate-13 11/26/2018   Td 11/14/2006, 04/18/2010   Td (Adult), 2 Lf Tetanus Toxid, Preservative Free 11/14/2006, 04/18/2010   Td (Adult),5 Lf Tetanus Toxid, Preservative Free 05/06/1999   Tdap 05/06/1999,  11/14/2006, 04/18/2010, 03/16/2021   Unspecified SARS-COV-2 Vaccination 01/16/2021, 02/13/2021   Zoster Recombinant(Shingrix) 11/20/2021   Past Medical History:  Diagnosis Date   Baker cyst    rt knee   Barrett's esophagus    GERD (gastroesophageal reflux disease)    Hyperlipidemia    Reflux    Sleep apnea    Social History   Socioeconomic History   Marital status: Married    Spouse name: Not on file   Number of children: 2   Years of education: Not on file   Highest education level: Not on file  Occupational History   Occupation: Medical Records Clerk    Comment: [employer]  Tobacco Use   Smoking status: Former    Current packs/day: 0.00    Average packs/day: 1 pack/day for 20.7 years (20.7 ttl pk-yrs)    Types: Cigarettes    Start date: 10/24/1977    Quit date: 07/09/1998    Years since quitting: 25.2   Smokeless tobacco: Never  Vaping Use   Vaping status: Never Used  Substance and Sexual Activity   Alcohol use: Yes    Comment: occasionally   Drug use: No   Sexual activity: Not on file  Other Topics Concern   Not on file  Social History Narrative   Lives with husband   Social Determinants of Health   Financial Resource Strain: Not on file  Food Insecurity: Not on file  Transportation Needs: Not on file  Physical  Activity: Not on file  Stress: Not on file  Social Connections: Not on file  Intimate Partner Violence: Not on file   Past Surgical History:  Procedure Laterality Date   CHOLECYSTECTOMY  2009   COLONOSCOPY     ESOPHAGOGASTRODUODENOSCOPY     NASAL SINUS SURGERY     TUBAL LIGATION     Family History  Problem Relation Age of Onset   Diabetes Mother    Irritable bowel syndrome Mother    Other Father        Hx unknown   Hypertension Brother    Diabetes type II Brother    Hypertension Brother    Diabetes type II Brother    Heart failure Maternal Grandmother        Died age 17   Colon cancer Neg Hx    Stomach cancer Neg Hx     Esophageal cancer Neg Hx    Colon polyps Neg Hx    Rectal cancer Neg Hx     Current Outpatient Medications:    esomeprazole (NEXIUM) 40 MG capsule, Take 1 capsule (40 mg total) by mouth daily., Disp: 90 capsule, Rfl: 3   ferrous sulfate 325 (65 FE) MG tablet, Take 325 mg by mouth 2 (two) times a week., Disp: , Rfl:    VITAMIN D, CHOLECALCIFEROL, PO, Take 1 tablet by mouth 2 (two) times a week. , Disp: , Rfl:    rosuvastatin (CRESTOR) 20 MG tablet, Take 1 tablet (20 mg total) by mouth daily., Disp: 90 tablet, Rfl: 3  Allergies  Allergen Reactions   Sulfonamide Derivatives Hives and Itching     ROS: Review of Systems Pertinent items noted in HPI and remainder of comprehensive ROS otherwise negative.    Physical exam BP 120/75   Pulse 68   Temp 98.4 F (36.9 C)   Ht 5' (1.524 m)   Wt 189 lb (85.7 kg)   SpO2 95%   BMI 36.91 kg/m  General appearance: alert, cooperative, appears stated age, no distress, and morbidly obese Head: Normocephalic, without obvious abnormality, atraumatic Eyes: negative findings: lids and lashes normal, conjunctivae and sclerae normal, corneas clear, and pupils equal, round, reactive to light and accomodation Ears: normal TM's and external ear canals both ears Nose: Nares normal. Septum midline. Mucosa normal. No drainage or sinus tenderness. Throat: lips, mucosa, and tongue normal; teeth and gums normal Neck: no adenopathy, no carotid bruit, no JVD, supple, symmetrical, trachea midline, and thyroid not enlarged, symmetric, no tenderness/mass/nodules Back: symmetric, no curvature. ROM normal. No CVA tenderness. Lungs: clear to auscultation bilaterally Heart: regular rate and rhythm, S1, S2 normal, no murmur, click, rub or gallop Abdomen: soft, non-tender; bowel sounds normal; no masses,  no organomegaly Extremities: extremities normal, atraumatic, no cyanosis or edema Pulses: 2+ and symmetric Skin: Skin color, texture, turgor normal. No rashes or  lesions Lymph nodes: Cervical, supraclavicular, and axillary nodes normal. Neurologic: Grossly normal      09/17/2023    8:21 AM 12/30/2022   11:26 AM 12/30/2022   11:25 AM  Depression screen PHQ 2/9  Decreased Interest 0 0 0  Down, Depressed, Hopeless 0 0 0  PHQ - 2 Score 0 0 0  Altered sleeping 0 1 0  Tired, decreased energy 0 1 0  Change in appetite 0 2 0  Feeling bad or failure about yourself  0 0 0  Trouble concentrating 0 0 0  Moving slowly or fidgety/restless 0 0 0  Suicidal thoughts 0 0 0  PHQ-9 Score  0 4 0  Difficult doing work/chores Not difficult at all Somewhat difficult Not difficult at all      09/17/2023    8:21 AM 12/30/2022   11:27 AM 09/11/2022    9:42 AM 11/20/2021    8:42 AM  GAD 7 : Generalized Anxiety Score  Nervous, Anxious, on Edge 0 0 0 1  Control/stop worrying 0 0 0 1  Worry too much - different things 0 0 0 1  Trouble relaxing 0 0 0 0  Restless 0 0 0 0  Easily annoyed or irritable 0 0 0 0  Afraid - awful might happen 0 0 0 0  Total GAD 7 Score 0 0 0 3  Anxiety Difficulty Not difficult at all Not difficult at all Not difficult at all Not difficult at all     Assessment/ Plan: Milderd Meager here for annual physical exam.   Annual physical exam  OSA on CPAP - Plan: Bayer DCA Hb A1c Waived, CBC  Barrett's esophagus with dysplasia - Plan: CBC, esomeprazole (NEXIUM) 40 MG capsule  Morbid obesity (HCC) - Plan: Bayer DCA Hb A1c Waived, CMP14+EGFR, Lipid Panel, TSH, Amb ref to Medical Nutrition Therapy-MNT  Pure hypercholesterolemia - Plan: rosuvastatin (CRESTOR) 20 MG tablet, Amb ref to Medical Nutrition Therapy-MNT  Osteopenia of left femoral neck - Plan: CMP14+EGFR, VITAMIN D 25 Hydroxy (Vit-D Deficiency, Fractures), Amb ref to Medical Nutrition Therapy-MNT  Screening for HIV (human immunodeficiency virus) - Plan: HIV antibody (with reflex)  Stress due to family tension - Plan: Ambulatory referral to Integrated Behavioral Health  Family  history of drug addiction - Plan: Ambulatory referral to Integrated Behavioral Health  Second vaccination administered.  Check A1c, CBC given history of OSA, morbid obesity.  Fasting labs also collected today.  I have switched her from Protonix to Nexium because she was having breakthrough symptoms with the Protonix.  She did note today that there was a drop in her hemoglobin to 11.7 when she tried to donate blood.  I have added on B12 and iron studies to her labs and hopefully these can be collected together.  Further intervention pending these results.  She will continue statin for cholesterol control.  Vitamin D level added given use of PPI and known osteopenia  Screening HIV collected  Referral to medical nutrition for obesity, hyperlipidemia.  Ongoing to see her back in 4 months for weight check and we may need to get discuss initiation of GLP at that time  I placed a referral to integrated behavioral health services because she has been having increased reactive depression and stress related to her son's addiction and complications related to that addiction.  She does not wish to start any medications at this time  Patient and/or legal guardian verbally consented to [facility] services about presenting concerns and psychiatric consultation as appropriate.  The services will be billed as appropriate for the patient   Counseled on healthy lifestyle choices, including diet (rich in fruits, vegetables and lean meats and low in salt and simple carbohydrates) and exercise (at least 30 minutes of moderate physical activity daily).  Patient to follow up 72m  Zema Lizardo M. Nadine Counts, DO

## 2023-09-18 LAB — LIPID PANEL
Cholesterol, Total: 143 mg/dL (ref 100–199)
HDL: 46 mg/dL (ref >39)
LDL CALC COMMENT:: 3.1 ratio (ref 0.0–4.4)
LDL Chol Calc (NIH): 75 mg/dL (ref 0–99)
Triglycerides: 125 mg/dL (ref 0–149)
VLDL Cholesterol Cal: 22 mg/dL (ref 5–40)

## 2023-09-18 LAB — CMP14+EGFR
ALT: 16 [IU]/L (ref 0–32)
AST: 20 [IU]/L (ref 0–40)
Albumin: 4.3 g/dL (ref 3.8–4.9)
Alkaline Phosphatase: 94 [IU]/L (ref 44–121)
BUN/Creatinine Ratio: 18 (ref 12–28)
BUN: 14 mg/dL (ref 8–27)
Bilirubin Total: 0.4 mg/dL (ref 0.0–1.2)
CO2: 21 mmol/L (ref 20–29)
Calcium: 9.3 mg/dL (ref 8.7–10.3)
Chloride: 104 mmol/L (ref 96–106)
Creatinine, Ser: 0.8 mg/dL (ref 0.57–1.00)
Globulin, Total: 2.1 g/dL (ref 1.5–4.5)
Glucose: 149 mg/dL — ABNORMAL HIGH (ref 70–99)
Potassium: 4.3 mmol/L (ref 3.5–5.2)
Sodium: 140 mmol/L (ref 134–144)
Total Protein: 6.4 g/dL (ref 6.0–8.5)
eGFR: 84 mL/min/{1.73_m2} (ref >59)

## 2023-09-18 LAB — TSH: TSH: 2.07 u[IU]/mL (ref 0.450–4.500)

## 2023-09-18 LAB — CBC
Hematocrit: 36.7 % (ref 34.0–46.6)
Hemoglobin: 12.2 g/dL (ref 11.1–15.9)
MCH: 31.6 pg (ref 26.6–33.0)
MCHC: 33.2 g/dL (ref 31.5–35.7)
MCV: 95 fL (ref 79–97)
Platelets: 164 10*3/uL (ref 150–450)
RBC: 3.86 x10E6/uL (ref 3.77–5.28)
RDW: 12.8 % (ref 11.7–15.4)
WBC: 3.9 10*3/uL (ref 3.4–10.8)

## 2023-09-18 LAB — VITAMIN D 25 HYDROXY (VIT D DEFICIENCY, FRACTURES): Vit D, 25-Hydroxy: 47.9 ng/mL (ref 30.0–100.0)

## 2023-09-18 LAB — HIV ANTIBODY (ROUTINE TESTING W REFLEX)

## 2023-09-20 LAB — VITAMIN B12: Vitamin B-12: 454 pg/mL (ref 232–1245)

## 2023-09-20 LAB — IRON: Iron: 63 ug/dL (ref 27–159)

## 2023-09-20 LAB — SPECIMEN STATUS REPORT

## 2023-10-12 ENCOUNTER — Telehealth: Payer: No Typology Code available for payment source | Admitting: Family Medicine

## 2023-10-12 DIAGNOSIS — R062 Wheezing: Secondary | ICD-10-CM

## 2023-10-12 NOTE — Progress Notes (Signed)
Because Rachel Larson, I feel your condition warrants further evaluation and I recommend that you be seen in a face to face visit.   NOTE: There will be NO CHARGE for this eVisit   If you are having a true medical emergency please call 911.      For an urgent face to face visit, [place] has eight urgent care centers for your convenience:   NEW!! [facility] at [facility] Get Driving Directions [phone] [address], [address] [place], [address]    [facility] Center at [facility] Get Driving Directions [phone] [address] [place], [place] [address]   [facility] Center [facility]) Get Driving Directions [phone] [address] [place], [place] [address]  [facility] Health Urgent Care Center [facility]) Get Driving Directions [phone] [address] [place],  [place]  [address]  [facility] Health Urgent Care Center [facility] - at [employer]  [phone] [address] [place],  [place] [address]   [facility] Health Urgent Care at [facility] Get Driving Directions [phone] [address] [place], [place] [address]   [facility] Health Urgent Care at [facility] Get Driving Directions  [phone] [address] [place], [place] [address]   [facility] Health Urgent Care at [facility] Directions [phone] [address] [place], [place] [address]  Your MyChart E-visit questionnaire answers were reviewed by a board certified advanced clinical practitioner to complete your personal care plan based on your specific symptoms.  Thank you for using e-Visits.      have provided 5 minutes of non face to face time during this encounter for chart review and documentation.

## 2023-10-13 ENCOUNTER — Ambulatory Visit: Payer: Self-pay | Admitting: Family Medicine

## 2023-10-13 NOTE — Telephone Encounter (Signed)
Chief Complaint: cough Symptoms: productive cough, yellow phlegm, SOB with exertion, ear fullness, sinus pressure Frequency: Began 12/4, seen in UC 12/7, finished abx 12/14 Pertinent Negatives: Patient denies Blood in sputum, fever, sore throat, vomiting Disposition: [] ED /[x] Urgent Care (no appt availability in office) / [] Appointment(In office/virtual)/ []  [place] [facility]/ [] Home Care/ [] Refused Recommended Disposition /[] [place] [facility]/ []  Follow-up with PCP Additional Notes: Pt called stating she has a productive cough that began 12/4, was treated with abx on 12/7 and finished 12/14 but her cough is worsening. States yesterday she was at the store and was coughing so hard she could not catch her breath. Reports now she has yellow phlegm with cough, sinus pressure, ear fullness. States she has increased SOB with activity, but is chronically SOB. Has a pulse ox at home but has not measured readings. Pt reports she used her mom's nebulizer twice yesterday with unknown medication and it provided some relief. States she has wheezing as well. During call, pt began coughing, sounded barky in nature. States she has used all medications and three bottles of cough medicine with no relief. Per protocol, pt to be evaluated within 24 hours. Next available appt with any provider is 10/24/23 at 1115. This RN recommended urgent care, pt agreeable. States she will go to [facility] urgent care on the way home from work today. Care advice reviewed, pt verbalized understanding. Alerting PCP for review.   Copied from CRM [id number]. Topic: Clinical - Pink Word Triage >> Oct 13, 2023 10:15 AM Eunice Blase wrote: Reason for Triage: Pt has been coughing and trouble breathing. Reason for Disposition  SEVERE coughing spells (e.g., whooping sound after coughing, vomiting after coughing)  Answer Assessment - Initial Assessment Questions 1. ONSET: "When did the cough begin?"      12/4, was treated by UC and given several  medications, finished those 12/14, cough will not go away 2. SEVERITY: "How bad is the cough today?"      Used mom't nebulizer last night and feels better- yesterday thought she was going to pass out when coughing. 3. SPUTUM: "Describe the color of your sputum" (none, dry cough; clear, white, yellow, green)     Yellow 4. HEMOPTYSIS: "Are you coughing up any blood?" If so ask: "How much?" (flecks, streaks, tablespoons, etc.)     Denies 5. DIFFICULTY BREATHING: "Are you having difficulty breathing?" If Yes, ask: "How bad is it?" (e.g., mild, moderate, severe)    - MILD: No SOB at rest, mild SOB with walking, speaks normally in sentences, can lie down, no retractions, pulse < 100.    - MODERATE: SOB at rest, SOB with minimal exertion and prefers to sit, cannot lie down flat, speaks in phrases, mild retractions, audible wheezing, pulse 100-120.    - SEVERE: Very SOB at rest, speaks in single words, struggling to breathe, sitting hunched forward, retractions, pulse > 120      Chronic SOB, seems worse with this cough. Moderate 6. FEVER: "Do you have a fever?" If Yes, ask: "What is your temperature, how was it measured, and when did it start?"     Denies 7. CARDIAC HISTORY: "Do you have any history of heart disease?" (e.g., heart attack, congestive heart failure)      Denies 8. LUNG HISTORY: "Do you have any history of lung disease?"  (e.g., pulmonary embolus, asthma, emphysema)     Denies 9. PE RISK FACTORS: "Do you have a history of blood clots?" (or: recent major surgery, recent prolonged travel,  bedridden)     Denies 10. OTHER SYMPTOMS: "Do you have any other symptoms?" (e.g., runny nose, wheezing, chest pain)       Runny, wheezing worse at night, chest discomfort with cough  12. TRAVEL: "Have you traveled out of the country in the last month?" (e.g., travel history, exposures)       Denies  Protocols used: Cough - Acute Productive-A-AH

## 2023-11-12 ENCOUNTER — Ambulatory Visit: Payer: No Typology Code available for payment source | Admitting: Nutrition

## 2023-11-18 ENCOUNTER — Telehealth: Payer: Self-pay | Admitting: Family Medicine

## 2023-12-09 ENCOUNTER — Ambulatory Visit: Payer: No Typology Code available for payment source | Admitting: Family Medicine

## 2023-12-10 ENCOUNTER — Ambulatory Visit: Payer: No Typology Code available for payment source | Admitting: Family Medicine

## 2023-12-24 ENCOUNTER — Ambulatory Visit: Payer: No Typology Code available for payment source | Admitting: Pulmonary Disease

## 2024-01-13 ENCOUNTER — Encounter: Payer: Self-pay | Admitting: Family Medicine

## 2024-01-19 ENCOUNTER — Ambulatory Visit: Payer: No Typology Code available for payment source | Admitting: Family Medicine

## 2024-01-21 ENCOUNTER — Ambulatory Visit: Payer: No Typology Code available for payment source | Admitting: Family Medicine

## 2024-01-21 VITALS — BP 112/70 | HR 64 | Temp 98.4°F | Ht 60.0 in | Wt 178.6 lb

## 2024-01-21 DIAGNOSIS — E66811 Obesity, class 1: Secondary | ICD-10-CM

## 2024-01-21 DIAGNOSIS — G4733 Obstructive sleep apnea (adult) (pediatric): Secondary | ICD-10-CM | POA: Diagnosis not present

## 2024-01-21 DIAGNOSIS — Z638 Other specified problems related to primary support group: Secondary | ICD-10-CM

## 2024-01-21 DIAGNOSIS — J301 Allergic rhinitis due to pollen: Secondary | ICD-10-CM

## 2024-01-21 MED ORDER — FLUTICASONE PROPIONATE 50 MCG/ACT NA SUSP: 2.0000 | Freq: Every day | NASAL | 6 refills | Status: AC

## 2024-01-21 NOTE — Progress Notes (Signed)
 Subjective: ZO:[id number] follow up PCP: Raliegh Ip, DO [id number] E Bhullar is a 63 y.o. female presenting to clinic today for:  1. Obesity She has really been cleaning up her diet since February.  She has eliminated sodas totally and drinks very lightly sweetened tea.  She mostly drinks water.  She has eliminated unnecessary refined sugars and has increased fiber, fruits and vegetables.  She is also watching her portion sizes and trying to walk a little bit more regularly.  She has had a little bit of right knee aggravation with walking and has noticed a little bit of radicular symptoms down the right lower extremity that she describes as a burning sensation but this is not always present.  She was not sure what she could do to alleviate this.  She reports no weakness or falls.  She never did see the nutritionist because that was ultimately canceled by them so she is just been doing this on her own  Since her last visit she is also really try to take a hold of establishing boundaries with her son.  She of course still worries about him and cares for him but she is putting her health first now.  She never did follow-up with a counselor because she is just going to try and work on this on her own.  She feels better and seems to be really doing better   ROS: Per HPI  Allergies  Allergen Reactions   Sulfonamide Derivatives Hives and Itching   Past Medical History:  Diagnosis Date   Baker cyst    rt knee   Barrett's esophagus    GERD (gastroesophageal reflux disease)    Hyperlipidemia    Reflux    Sleep apnea     Current Outpatient Medications:    esomeprazole (NEXIUM) 40 MG capsule, Take 1 capsule (40 mg total) by mouth daily., Disp: 90 capsule, Rfl: 3   rosuvastatin (CRESTOR) 20 MG tablet, Take 1 tablet (20 mg total) by mouth daily., Disp: 90 tablet, Rfl: 3   VITAMIN D, CHOLECALCIFEROL, PO, Take 1 tablet by mouth 2 (two) times a week. , Disp: , Rfl:  Social History    Socioeconomic History   Marital status: Married    Spouse name: Not on file   Number of children: 2   Years of education: Not on file   Highest education level: Some college, no degree  Occupational History   Occupation: Geneticist, molecular    Comment: [employer]  Tobacco Use   Smoking status: Former    Current packs/day: 0.00    Average packs/day: 1 pack/day for 20.7 years (20.7 ttl pk-yrs)    Types: Cigarettes    Start date: 10/24/1977    Quit date: 07/09/1998    Years since quitting: 25.5   Smokeless tobacco: Never  Vaping Use   Vaping status: Never Used  Substance and Sexual Activity   Alcohol use: Yes    Comment: occasionally   Drug use: No   Sexual activity: Not on file  Other Topics Concern   Not on file  Social History Narrative   Lives with husband   Social Drivers of Health   Financial Resource Strain: Medium Risk (01/21/2024)   Overall Financial Resource Strain (CARDIA)    Difficulty of Paying Living Expenses: Somewhat hard  Food Insecurity: No Food Insecurity (01/21/2024)   Hunger Vital Sign    Worried About Running Out of Food in the Last Year: Never true    Ran  Out of Food in the Last Year: Never true  Transportation Needs: No Transportation Needs (01/21/2024)   PRAPARE - Administrator, Civil Service (Medical): No    Lack of Transportation (Non-Medical): No  Physical Activity: Insufficiently Active (01/21/2024)   Exercise Vital Sign    Days of Exercise per Week: 3 days    Minutes of Exercise per Session: 20 min  Stress: No Stress Concern Present (01/21/2024)   [employer] of Occupational Health - Occupational Stress Questionnaire    Feeling of Stress : Only a little  Social Connections: Socially Integrated (01/21/2024)   Social Connection and Isolation Panel [NHANES]    Frequency of Communication with Friends and Family: Three times a week    Frequency of Social Gatherings with Friends and Family: Once a week    Attends Religious  Services: More than 4 times per year    Active Member of [employer] or Organizations: Yes    Attends Engineer, structural: More than 4 times per year    Marital Status: Married  Catering manager Violence: Not on file   Family History  Problem Relation Age of Onset   Diabetes Mother    Irritable bowel syndrome Mother    Other Father        Hx unknown   Hypertension Brother    Diabetes type II Brother    Hypertension Brother    Diabetes type II Brother    Heart failure Maternal Grandmother        Died age 42   Colon cancer Neg Hx    Stomach cancer Neg Hx    Esophageal cancer Neg Hx    Colon polyps Neg Hx    Rectal cancer Neg Hx     Objective: Office vital signs reviewed. BP 112/70   Pulse 64   Temp 98.4 F (36.9 C)   Ht 5' (1.524 m)   Wt 178 lb 9.6 oz (81 kg)   SpO2 99%   BMI 34.88 kg/m   Physical Examination:  General: Awake, alert, well appearing obese female, No acute distress HEENT: sclera white, MMM Cardio: regular rate and rhythm  Pulm: Normal work of breathing on room air with no appreciable wheezing     01/21/2024    3:24 PM 09/17/2023    8:21 AM 12/30/2022   11:26 AM  Depression screen PHQ 2/9  Decreased Interest 0 0 0  Down, Depressed, Hopeless 0 0 0  PHQ - 2 Score 0 0 0  Altered sleeping 0 0 1  Tired, decreased energy 0 0 1  Change in appetite 0 0 2  Feeling bad or failure about yourself  0 0 0  Trouble concentrating 0 0 0  Moving slowly or fidgety/restless 0 0 0  Suicidal thoughts 0 0 0  PHQ-9 Score 0 0 4  Difficult doing work/chores Not difficult at all Not difficult at all Somewhat difficult      01/21/2024    3:24 PM 09/17/2023    8:21 AM 12/30/2022   11:27 AM 09/11/2022    9:42 AM  GAD 7 : Generalized Anxiety Score  Nervous, Anxious, on Edge 0 0 0 0  Control/stop worrying 0 0 0 0  Worry too much - different things 0 0 0 0  Trouble relaxing 0 0 0 0  Restless 0 0 0 0  Easily annoyed or irritable 0 0 0 0  Afraid - awful might happen  0 0 0 0  Total GAD 7 Score 0 0  0 0  Anxiety Difficulty  Not difficult at all Not difficult at all Not difficult at all    Assessment/ Plan: 63 y.o. female   Obesity (BMI 30.0-34.9) - Plan: CMP14+EGFR, Bayer DCA Hb A1c Waived  OSA on CPAP  Stress due to family tension  Seasonal allergic rhinitis due to pollen - Plan: fluticasone (FLONASE) 50 MCG/ACT nasal spray  She has had at least 10 pound weight loss since her last visit and is doing excellently.  She has reduced her BMI from morbid obesity at over 36, almost 37-34.88 today.  I congratulated her on this achievement and encouraged her to continue putting her health first.  She seemed to be in much better spirits.  We went ahead and got fasting labs to recheck that sugar  We did not talk about allergic rhinitis at length today but I did go ahead and renew her Flonase.  She may follow-up in 6 months with me, sooner if concerns arise   Raliegh Ip, DO [facility] [phone]

## 2024-01-22 LAB — BAYER DCA HB A1C WAIVED: HB A1C (BAYER DCA - WAIVED): 5.2 % (ref 4.8–5.6)

## 2024-01-22 LAB — CMP14+EGFR
ALT: 14 IU/L (ref 0–32)
AST: 18 IU/L (ref 0–40)
Albumin: 4.3 g/dL (ref 3.9–4.9)
Alkaline Phosphatase: 82 IU/L (ref 44–121)
BUN/Creatinine Ratio: 19 (ref 12–28)
BUN: 13 mg/dL (ref 8–27)
Bilirubin Total: 0.5 mg/dL (ref 0.0–1.2)
CO2: 21 mmol/L (ref 20–29)
Calcium: 9.4 mg/dL (ref 8.7–10.3)
Chloride: 104 mmol/L (ref 96–106)
Creatinine, Ser: 0.7 mg/dL (ref 0.57–1.00)
Globulin, Total: 2.2 g/dL (ref 1.5–4.5)
Glucose: 83 mg/dL (ref 70–99)
Potassium: 3.9 mmol/L (ref 3.5–5.2)
Sodium: 142 mmol/L (ref 134–144)
Total Protein: 6.5 g/dL (ref 6.0–8.5)
eGFR: 98 mL/min/{1.73_m2} (ref >59)

## 2024-01-23 ENCOUNTER — Encounter: Payer: Self-pay | Admitting: Family Medicine

## 2024-02-20 ENCOUNTER — Encounter: Payer: Self-pay | Admitting: Radiology

## 2024-02-25 ENCOUNTER — Ambulatory Visit: Admitting: Pulmonary Disease

## 2024-02-25 ENCOUNTER — Encounter: Payer: Self-pay | Admitting: Pulmonary Disease

## 2024-02-25 VITALS — BP 113/63 | HR 66 | Ht 60.0 in | Wt 175.0 lb

## 2024-02-25 DIAGNOSIS — G4733 Obstructive sleep apnea (adult) (pediatric): Secondary | ICD-10-CM | POA: Diagnosis not present

## 2024-02-25 DIAGNOSIS — Z87891 Personal history of nicotine dependence: Secondary | ICD-10-CM

## 2024-02-25 NOTE — Patient Instructions (Signed)
 I will see you back in about 3 months  We will order a home sleep test  Graded activities as tolerated  Continue weight loss efforts  Call us  with significant concerns

## 2024-02-25 NOTE — Progress Notes (Signed)
 Rachel Larson    [id number]    08/20/1961  Primary Care Physician:Gottschalk, Gwendalyn Lemma, DO  Referring Physician: Eliodoro Guerin, DO [address] [place],  [place] [address]  Chief complaint:    Patient was last seen here in 2021  HPI:  History of obstructive sleep apnea for which she was on CPAP She discontinued CPAP because she was not tolerating it okay  Recently had a lower respiratory infection following shingles shot in December Required 2 rounds of antibiotics and steroids Multiple other interventions including inhalers, Tessalon  Perles  Symptoms of shortness of breath have improved  She is trying to get more active still gets a little winded with activity otherwise better  Did talk about the history of sleep apnea She is willing to get reevaluated as she is aware that there are risks with not treating sleep disordered breathing She remembers just not been able to tolerate it well Other options of treatment discussed including surgical options, inspire device as an option of treatment if CPAP not tolerated  She does get tired during the day but not overall very sleepy Quit smoking in 1999, history of reflux, sore throats  No pertinent occupational history  Outpatient Encounter Medications as of 02/25/2024  Medication Sig   albuterol  (PROVENTIL ) (2.5 MG/3ML) 0.083% nebulizer solution Take 2.5 mg by nebulization every 4 (four) hours as needed.   albuterol  (VENTOLIN  HFA) 108 (90 Base) MCG/ACT inhaler Inhale 2 puffs into the lungs every 6 (six) hours as needed.   esomeprazole  (NEXIUM ) 40 MG capsule Take 1 capsule (40 mg total) by mouth daily.   fluticasone  (FLONASE ) 50 MCG/ACT nasal spray Place 2 sprays into both nostrils daily.   rosuvastatin  (CRESTOR ) 20 MG tablet Take 1 tablet (20 mg total) by mouth daily.   VITAMIN D , CHOLECALCIFEROL, PO Take 1 tablet by mouth 2 (two) times a week.    No facility-administered encounter medications on file as of  02/25/2024.    Allergies as of 02/25/2024 - Review Complete 02/25/2024  Allergen Reaction Noted   Sulfonamide derivatives Hives and Itching 06/26/2011    Past Medical History:  Diagnosis Date   Baker cyst    rt knee   Barrett's esophagus    GERD (gastroesophageal reflux disease)    Hyperlipidemia    Reflux    Sleep apnea     Past Surgical History:  Procedure Laterality Date   CHOLECYSTECTOMY  2009   COLONOSCOPY     ESOPHAGOGASTRODUODENOSCOPY     NASAL SINUS SURGERY     TUBAL LIGATION      Family History  Problem Relation Age of Onset   Diabetes Mother    Irritable bowel syndrome Mother    Other Father        Hx unknown   Hypertension Brother    Diabetes type II Brother    Hypertension Brother    Diabetes type II Brother    Heart failure Maternal Grandmother        Died age 59   Colon cancer Neg Hx    Stomach cancer Neg Hx    Esophageal cancer Neg Hx    Colon polyps Neg Hx    Rectal cancer Neg Hx     Social History   Socioeconomic History   Marital status: Married    Spouse name: Not on file   Number of children: 2   Years of education: Not on file   Highest education level: Some college, no  degree  Occupational History   Occupation: Medical Records Clerk    Comment: [employer]  Tobacco Use   Smoking status: Former    Current packs/day: 0.00    Average packs/day: 1 pack/day for 20.7 years (20.7 ttl pk-yrs)    Types: Cigarettes    Start date: 10/24/1977    Quit date: 07/09/1998    Years since quitting: 25.6   Smokeless tobacco: Never  Vaping Use   Vaping status: Never Used  Substance and Sexual Activity   Alcohol use: Yes    Comment: occasionally   Drug use: No   Sexual activity: Not on file  Other Topics Concern   Not on file  Social History Narrative   Lives with husband   Social Drivers of Health   Financial Resource Strain: Medium Risk (01/21/2024)   Overall Financial Resource Strain (CARDIA)    Difficulty of Paying Living  Expenses: Somewhat hard  Food Insecurity: No Food Insecurity (01/21/2024)   Hunger Vital Sign    Worried About Running Out of Food in the Last Year: Never true    Ran Out of Food in the Last Year: Never true  Transportation Needs: No Transportation Needs (01/21/2024)   PRAPARE - Administrator, Civil Service (Medical): No    Lack of Transportation (Non-Medical): No  Physical Activity: Insufficiently Active (01/21/2024)   Exercise Vital Sign    Days of Exercise per Week: 3 days    Minutes of Exercise per Session: 20 min  Stress: No Stress Concern Present (01/21/2024)   [employer] of Occupational Health - Occupational Stress Questionnaire    Feeling of Stress : Only a little  Social Connections: Socially Integrated (01/21/2024)   Social Connection and Isolation Panel [NHANES]    Frequency of Communication with Friends and Family: Three times a week    Frequency of Social Gatherings with Friends and Family: Once a week    Attends Religious Services: More than 4 times per year    Active Member of [employer] or Organizations: Yes    Attends Engineer, structural: More than 4 times per year    Marital Status: Married  Catering manager Violence: Not on file    Review of Systems  Respiratory:  Positive for apnea and shortness of breath.   Psychiatric/Behavioral:  Positive for sleep disturbance.     Vitals:   02/25/24 1544  BP: 113/63  Pulse: 66  SpO2: 97%     Physical Exam Constitutional:      Appearance: She is obese.  HENT:     Head: Normocephalic.     Mouth/Throat:     Mouth: Mucous membranes are moist.  Eyes:     General: No scleral icterus. Cardiovascular:     Rate and Rhythm: Normal rate and regular rhythm.     Heart sounds: No murmur heard.    No friction rub.  Pulmonary:     Effort: No respiratory distress.     Breath sounds: No stridor. No wheezing or rhonchi.  Musculoskeletal:     Cervical back: No rigidity or tenderness.  Neurological:      Mental Status: She is alert.  Psychiatric:        Mood and Affect: Mood normal.     Data Reviewed: Most recent chest x-ray reviewed showing mild cardiomegaly, no acute infiltrate  Assessment:  Shortness of breath with improving symptoms  Recent lower respiratory infection - Resolved  History of obstructive sleep apnea - Risk associated with untreated sleep  disordered breathing was discussed today - Treatment options for sleep disordered breathing discussed  Plan/Recommendations: Schedule for a home sleep test  Currently not requiring any inhalers  No clear indication for repeating chest x-ray at present  Can consider pulmonary function test if shortness of breath Quit smoking over 25 years ago  Encouraged to call with significant concerns  Follow-up in about 3 months   Myer Artis MD [facility] 02/25/2024, 3:56 PM  CC: Eliodoro Guerin, DO

## 2024-04-14 ENCOUNTER — Ambulatory Visit (INDEPENDENT_AMBULATORY_CARE_PROVIDER_SITE_OTHER)

## 2024-04-14 ENCOUNTER — Ambulatory Visit: Admitting: Family Medicine

## 2024-04-14 ENCOUNTER — Encounter: Payer: Self-pay | Admitting: Family Medicine

## 2024-04-14 VITALS — BP 99/70 | HR 74 | Temp 98.5°F | Ht 60.0 in | Wt 174.0 lb

## 2024-04-14 DIAGNOSIS — R053 Chronic cough: Secondary | ICD-10-CM

## 2024-04-14 DIAGNOSIS — R062 Wheezing: Secondary | ICD-10-CM

## 2024-04-14 DIAGNOSIS — N3281 Overactive bladder: Secondary | ICD-10-CM

## 2024-04-14 MED ORDER — AIRSUPRA 90-80 MCG/ACT IN AERO: 2.0000 | INHALATION_SPRAY | Freq: Four times a day (QID) | RESPIRATORY_TRACT | 0 refills | Status: AC

## 2024-04-14 MED ORDER — AMOXICILLIN 875 MG PO TABS: 875.0000 mg | ORAL_TABLET | Freq: Two times a day (BID) | ORAL | 0 refills | Status: AC

## 2024-04-14 MED ORDER — OXYBUTYNIN CHLORIDE ER 5 MG PO TB24: 5.0000 mg | ORAL_TABLET | Freq: Every day | ORAL | 3 refills | Status: DC

## 2024-04-14 NOTE — Progress Notes (Signed)
 Subjective: [name] cough/ wheezing/ SOB PCP: [name], DO YEP:Rachel Larson is a 63 y.o. female presenting to clinic today for:  1. Chronic cough/ wheezing/ SOB Reports that this onset at the end of May.  Saw pulmonology in early May and per that note, no symptoms at that time requiring imaging, PFTs or inhalers but plan to obtain if symptoms recurred. She sought care at the urgent care and was placed on Z-Pak, prednisone , Mucinex , Tessalon  Perles.  However, symptoms have not resolved.  She had a similar episode in December which resulted in several weeks of cough.  She worries about her lungs.  She has not yet reached back out to her pulmonologist.  No hemoptysis, unplanned weight loss or night sweats reported.  Wheezing is primarily at nighttime after coughing spells  2.  Overactive bladder Reports overactive bladder with urge incontinence.  She has been been on any medications for this but she thinks maybe at some point she went to see a bladder specialist.  Denies any hematuria, dysuria, vaginal prolapse.  She has tried increasing her water from 1 bottle to 3 bottles as she typically does not drink well   ROS: Per HPI  Allergies  Allergen Reactions   Sulfonamide Derivatives Hives and Itching   Past Medical History:  Diagnosis Date   Baker cyst    rt knee   Barrett's esophagus    GERD (gastroesophageal reflux disease)    Hyperlipidemia    Reflux    Sleep apnea     Current Outpatient Medications:    albuterol  (PROVENTIL ) (2.5 MG/3ML) 0.083% nebulizer solution, Take 2.5 mg by nebulization every 4 (four) hours as needed., Disp: , Rfl:    albuterol  (VENTOLIN  HFA) 108 (90 Base) MCG/ACT inhaler, Inhale 2 puffs into the lungs every 6 (six) hours as needed., Disp: , Rfl:    esomeprazole  (NEXIUM ) 40 MG capsule, Take 1 capsule (40 mg total) by mouth daily., Disp: 90 capsule, Rfl: 3   fluticasone  (FLONASE ) 50 MCG/ACT nasal spray, Place 2 sprays into both nostrils  daily., Disp: 16 g, Rfl: 6   rosuvastatin  (CRESTOR ) 20 MG tablet, Take 1 tablet (20 mg total) by mouth daily., Disp: 90 tablet, Rfl: 3   VITAMIN D , CHOLECALCIFEROL, PO, Take 1 tablet by mouth 2 (two) times a week. , Disp: , Rfl:  Social History   Socioeconomic History   Marital status: Married    Spouse name: Not on file   Number of children: 2   Years of education: Not on file   Highest education level: Some college, no degree  Occupational History   Occupation: Geneticist, molecular    Comment: [employer]  Tobacco Use   Smoking status: Former    Current packs/day: 0.00    Average packs/day: 1 pack/day for 20.7 years (20.7 ttl pk-yrs)    Types: Cigarettes    Start date: 10/24/1977    Quit date: 07/09/1998    Years since quitting: 25.7   Smokeless tobacco: Never  Vaping Use   Vaping status: Never Used  Substance and Sexual Activity   Alcohol use: Yes    Comment: occasionally   Drug use: No   Sexual activity: Not on file  Other Topics Concern   Not on file  Social History Narrative   Lives with husband   Social Drivers of Health   Financial Resource Strain: Medium Risk (01/21/2024)   Overall Financial Resource Strain (CARDIA)    Difficulty of Paying Living Expenses: Somewhat hard  Food Insecurity: No Food Insecurity (01/21/2024)   Hunger Vital Sign    Worried About Running Out of Food in the Last Year: Never true    Ran Out of Food in the Last Year: Never true  Transportation Needs: No Transportation Needs (01/21/2024)   PRAPARE - Administrator, Civil Service (Medical): No    Lack of Transportation (Non-Medical): No  Physical Activity: Insufficiently Active (01/21/2024)   Exercise Vital Sign    Days of Exercise per Week: 3 days    Minutes of Exercise per Session: 20 min  Stress: No Stress Concern Present (01/21/2024)   [employer] of Occupational Health - Occupational Stress Questionnaire    Feeling of Stress : Only a little  Social Connections:  Socially Integrated (01/21/2024)   Social Connection and Isolation Panel    Frequency of Communication with Friends and Family: Three times a week    Frequency of Social Gatherings with Friends and Family: Once a week    Attends Religious Services: More than 4 times per year    Active Member of [employer] or Organizations: Yes    Attends Engineer, structural: More than 4 times per year    Marital Status: Married  Catering manager Violence: Not on file   Family History  Problem Relation Age of Onset   Diabetes Mother    Irritable bowel syndrome Mother    Other Father        Hx unknown   Hypertension Brother    Diabetes type II Brother    Hypertension Brother    Diabetes type II Brother    Heart failure Maternal Grandmother        Died age 39   Colon cancer Neg Hx    Stomach cancer Neg Hx    Esophageal cancer Neg Hx    Colon polyps Neg Hx    Rectal cancer Neg Hx     Objective: Office vital signs reviewed. BP 99/70   Pulse 74   Temp 98.5 F (36.9 C)   Ht 5' (1.524 m)   Wt 174 lb (78.9 kg)   SpO2 97%   BMI 33.98 kg/m   Physical Examination:  General: Awake, alert, nontoxic female, No acute distress HEENT: Sclera white.  Moist mucous membranes Cardio: regular rate and rhythm, S1S2 heard, no murmurs appreciated Pulm: clear to auscultation bilaterally, no wheezes, rhonchi or rales; normal work of breathing on room air GU: No suprapubic tenderness palpation.  No palpable masses.  No distention of the bladder palpated.  Assessment/ Plan: 63 y.o. female   Chronic cough - Plan: DG Chest 2 View, Albuterol -Budesonide (AIRSUPRA) 90-80 MCG/ACT AERO, amoxicillin  (AMOXIL ) 875 MG tablet  Wheezing - Plan: DG Chest 2 View, Albuterol -Budesonide (AIRSUPRA) 90-80 MCG/ACT AERO, amoxicillin  (AMOXIL ) 875 MG tablet  Overactive bladder - Plan: oxybutynin (DITROPAN XL) 5 MG 24 hr tablet  No active wheezing on exam.  I am going to trial her on Airsupra and I gave her a coupon for this  today.  I have also gone ahead and prescribed her some amoxicillin  as she reported this helped before.  I will CC her pulmonologist as FYI because they look like they were planning on PFTs etc. should she develop symptoms again.  She knows to contact them for an appointment as well  For overactive bladder trial Ditropan.  Will plan for urology if symptoms are unresolving   Rachel CHRISTELLA Fielding, DO [facility] [phone]

## 2024-04-14 NOTE — Patient Instructions (Signed)

## 2024-04-16 ENCOUNTER — Ambulatory Visit: Payer: Self-pay | Admitting: Family Medicine

## 2024-05-24 ENCOUNTER — Telehealth: Payer: Self-pay | Admitting: Pulmonary Disease

## 2024-05-24 NOTE — Telephone Encounter (Signed)
 Called patient to verify her tect no verification for upcoming appt on 05/27/2024. Pt did want to verify if she would need a PFT or sleep study comp prior to her f/u. I did verify that the sleep study was ordered through [employer], but no PFT at this time.   She did request if we could assist with having her complete one that is more cost effective with her insurance. Stated that the [employer] study would have been $250 and she simply cannot afford that at this time.   She did express understand that we will look into this for her and be in contact regarding this study.   She has now rescheduled for 08/04/2024 to ensure that her sleep study has been completed.   Could you all assist with coordinating a sleep study for this patient?

## 2024-05-24 NOTE — Telephone Encounter (Signed)
 Got pt scheduled for 8/22 she wants to be called with the cost of her sleep study. Said I would have someone reach out to her

## 2024-05-24 NOTE — Telephone Encounter (Signed)
 Left voice mail to get scheudled and left direct line so she could call back and get her in for sleep study

## 2024-05-27 ENCOUNTER — Ambulatory Visit: Admitting: Pulmonary Disease

## 2024-06-07 ENCOUNTER — Telehealth: Payer: Self-pay | Admitting: Pulmonary Disease

## 2024-06-07 NOTE — Telephone Encounter (Unsigned)
 Copied from CRM #[id number]. Topic: Appointments - Scheduling Inquiry for Clinic >> Jun 07, 2024  3:38 PM Rachel Larson wrote: Reason for CRM: Patient calling to cancel sleep study appointment on 8/22 - states she does not want to reschedule.

## 2024-06-08 NOTE — Telephone Encounter (Addendum)
 Called Mrs. Golladay to confirm cancellation of HST scheduled for 8/22. Left detailed message informing her that the study will be canceled and advising her to call back when she is ready to rescheduled

## 2024-06-11 ENCOUNTER — Encounter

## 2024-07-21 ENCOUNTER — Encounter: Payer: Self-pay | Admitting: Family Medicine

## 2024-08-04 ENCOUNTER — Ambulatory Visit: Admitting: Pulmonary Disease

## 2024-09-15 ENCOUNTER — Other Ambulatory Visit: Payer: Self-pay | Admitting: Family Medicine

## 2024-09-15 DIAGNOSIS — R053 Chronic cough: Secondary | ICD-10-CM

## 2024-09-15 DIAGNOSIS — R062 Wheezing: Secondary | ICD-10-CM

## 2024-09-29 ENCOUNTER — Encounter: Payer: Self-pay | Admitting: Family Medicine

## 2024-09-29 ENCOUNTER — Ambulatory Visit: Payer: Self-pay | Admitting: Family Medicine

## 2024-09-29 VITALS — BP 112/64 | HR 65 | Temp 98.2°F | Ht 60.0 in | Wt 178.5 lb

## 2024-09-29 DIAGNOSIS — Z6834 Body mass index (BMI) 34.0-34.9, adult: Secondary | ICD-10-CM

## 2024-09-29 DIAGNOSIS — R739 Hyperglycemia, unspecified: Secondary | ICD-10-CM

## 2024-09-29 DIAGNOSIS — G4733 Obstructive sleep apnea (adult) (pediatric): Secondary | ICD-10-CM

## 2024-09-29 DIAGNOSIS — N3281 Overactive bladder: Secondary | ICD-10-CM

## 2024-09-29 DIAGNOSIS — M85852 Other specified disorders of bone density and structure, left thigh: Secondary | ICD-10-CM

## 2024-09-29 DIAGNOSIS — K22719 Barrett's esophagus with dysplasia, unspecified: Secondary | ICD-10-CM

## 2024-09-29 DIAGNOSIS — E782 Mixed hyperlipidemia: Secondary | ICD-10-CM

## 2024-09-29 DIAGNOSIS — Z0000 Encounter for general adult medical examination without abnormal findings: Secondary | ICD-10-CM

## 2024-09-29 LAB — BAYER DCA HB A1C WAIVED: HB A1C (BAYER DCA - WAIVED): 5.3 % (ref 4.8–5.6)

## 2024-09-29 MED ORDER — ROSUVASTATIN CALCIUM 20 MG PO TABS: 20.0000 mg | ORAL_TABLET | Freq: Every day | ORAL | 3 refills | Status: AC

## 2024-09-29 MED ORDER — ESOMEPRAZOLE MAGNESIUM 40 MG PO CPDR: 40.0000 mg | DELAYED_RELEASE_CAPSULE | Freq: Every day | ORAL | 3 refills | Status: AC

## 2024-09-29 NOTE — Progress Notes (Signed)
 Rachel Larson is a 63 y.o. female presents to office today for annual physical exam examination.    Patient reports that overall she has been doing well.  She did find the oxybutynin  to be helpful for the bladder but she notes it was too drying so she discontinued it.  She has been compliant with the Crestor  most days but admits that sometimes she skips a few days.  She does not always utilize Nexium  and tries to space that out every couple of weeks only if she needs it.  Denies any abdominal pain, nausea, vomiting, GI bleeding.  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  She is hoping that she can get back into more exercise again after they get that position filled.  Continues to work with [employer] and notes that they have identified her lifetime member weight goal as 125 pounds but she is not sure that this could ever be achieved for her so she is asking if I am in a medical agreement with that goal.  She does report occasionally having some postnasal drainage.  Health Maintenance Due  Topic Date Due   Pneumococcal Vaccine: 50+ Years (2 of 2 - PCV20 or PCV21) 11/27/2019   Influenza Vaccine  05/21/2024   Bone Density Scan  09/11/2024    Immunization History  Administered Date(s) Administered   Hepatitis B, ADULT 07/01/2014, 08/04/2014, 01/02/2015   Influenza,inj,Quad PF,6+ Mos 07/22/2013, 08/02/2014, 07/02/2019   Influenza-Unspecified 07/19/2021, 07/22/2023   MMR 11/14/2006   Moderna Sars-Covid-2 Vaccination 01/16/2021, 02/13/2021   PPD Test 01/31/2014, 07/02/2019   Pneumococcal Conjugate-13 11/26/2018   Td 11/14/2006, 04/18/2010   Td (Adult), 2 Lf Tetanus Toxid, Preservative Free 11/14/2006, 04/18/2010   Td (Adult),5 Lf Tetanus Toxid, Preservative Free 05/06/1999   Td (Adult),unspecified 11/14/2006, 04/18/2010   Tdap 05/06/1999, 11/14/2006, 04/18/2010, 03/16/2021    Unspecified SARS-COV-2 Vaccination 01/16/2021, 02/13/2021   Zoster Recombinant(Shingrix ) 11/20/2021, 09/17/2023   Past Medical History:  Diagnosis Date   Baker cyst    rt knee   Barrett's esophagus    Chest pain 06/26/2011   IMO SNOMED Dx Update Oct 2024     GERD (gastroesophageal reflux disease)    Hyperlipidemia    Reflux    Sleep apnea    Social History   Socioeconomic History   Marital status: Married    Spouse name: Not on file   Number of children: 2   Years of education: Not on file   Highest education level: Some college, no degree  Occupational History   Occupation: Geneticist, Molecular    Comment: [employer]  Tobacco Use   Smoking status: Former    Current packs/day: 0.00    Average packs/day: 1 pack/day for 20.7 years (20.7 ttl pk-yrs)    Types: Cigarettes    Start date: 10/24/1977    Quit date: 07/09/1998    Years since quitting: 26.2   Smokeless tobacco: Never  Vaping Use   Vaping status: Never Used  Substance and Sexual Activity   Alcohol use: Yes    Comment: occasionally   Drug use: No   Sexual activity: Not on file  Other Topics Concern   Not on file  Social History Narrative   Lives with husband   Social Drivers of Health   Financial Resource Strain: Medium Risk (01/21/2024)   Overall Financial Resource Strain (CARDIA)    Difficulty of  Paying Living Expenses: Somewhat hard  Food Insecurity: No Food Insecurity (01/21/2024)   Hunger Vital Sign    Worried About Running Out of Food in the Last Year: Never true    Ran Out of Food in the Last Year: Never true  Transportation Needs: No Transportation Needs (01/21/2024)   PRAPARE - Administrator, Civil Service (Medical): No    Lack of Transportation (Non-Medical): No  Physical Activity: Insufficiently Active (01/21/2024)   Exercise Vital Sign    Days of Exercise per Week: 3 days    Minutes of Exercise per Session: 20 min  Stress: No Stress Concern Present (01/21/2024)   [employer] - Occupational Stress Questionnaire    Feeling of Stress : Only a little  Social Connections: Socially Integrated (01/21/2024)   Social Connection and Isolation Panel    Frequency of Communication with Friends and Family: Three times a week    Frequency of Social Gatherings with Friends and Family: Once a week    Attends Religious Services: More than 4 times per year    Active Member of [employer] or Organizations: Yes    Attends Engineer, Structural: More than 4 times per year    Marital Status: Married  Catering Manager Violence: Not on file   Past Surgical History:  Procedure Laterality Date   CHOLECYSTECTOMY  2009   COLONOSCOPY     ESOPHAGOGASTRODUODENOSCOPY     NASAL SINUS SURGERY     TUBAL LIGATION     Family History  Problem Relation Age of Onset   Diabetes Mother    Irritable bowel syndrome Mother    Other Father        Hx unknown   Hypertension Brother    Diabetes type II Brother    Hypertension Brother    Diabetes type II Brother    Heart failure Maternal Grandmother        Died age 15   Colon cancer Neg Hx    Stomach cancer Neg Hx    Esophageal cancer Neg Hx    Colon polyps Neg Hx    Rectal cancer Neg Hx     Current Outpatient Medications:    albuterol  (PROVENTIL ) (2.5 MG/3ML) 0.083% nebulizer solution, Take 2.5 mg by nebulization every 4 (four) hours as needed., Disp: , Rfl:    Albuterol -Budesonide (AIRSUPRA ) 90-80 MCG/ACT AERO, Inhale 2 puffs into the lungs every 6 (six) hours., Disp: 10.7 g, Rfl: 0   esomeprazole  (NEXIUM ) 40 MG capsule, Take 1 capsule (40 mg total) by mouth daily., Disp: 90 capsule, Rfl: 3   fluticasone  (FLONASE ) 50 MCG/ACT nasal spray, Place 2 sprays into both nostrils daily., Disp: 16 g, Rfl: 6   oxybutynin  (DITROPAN  XL) 5 MG 24 hr tablet, Take 1 tablet (5 mg total) by mouth at bedtime. For overactive bladder, Disp: 90 tablet, Rfl: 3   rosuvastatin  (CRESTOR ) 20 MG tablet, Take 1 tablet (20 mg total) by  mouth daily., Disp: 90 tablet, Rfl: 3   VITAMIN D , CHOLECALCIFEROL, PO, Take 1 tablet by mouth 2 (two) times a week. , Disp: , Rfl:   Allergies  Allergen Reactions   Sulfonamide Derivatives Hives and Itching     ROS: Review of Systems Pertinent items noted in HPI and remainder of comprehensive ROS otherwise negative.    Physical exam BP 112/64   Pulse 65   Temp 98.2 F (36.8 C)   Ht 5' (1.524 m)   Wt 178 lb 8 oz (81 kg)  SpO2 97%   BMI 34.86 kg/m  General appearance: alert, cooperative, appears stated age, no distress, and mildly obese Head: Normocephalic, without obvious abnormality, atraumatic Eyes: negative findings: lids and lashes normal, conjunctivae and sclerae normal, corneas clear, and pupils equal, round, reactive to light and accomodation Ears: normal TM's and external ear canals both ears Nose: Nares normal. Septum midline. Mucosa normal. No drainage or sinus tenderness. Throat: lips, mucosa, and tongue normal; teeth and gums normal Neck: no adenopathy, no carotid bruit, supple, symmetrical, trachea midline, and thyroid  not enlarged, symmetric, no tenderness/mass/nodules Back: symmetric, no curvature. ROM normal. No CVA tenderness. Lungs: clear to auscultation bilaterally Heart: regular rate and rhythm, S1, S2 normal, no murmur, click, rub or gallop Abdomen: soft, non-tender; bowel sounds normal; no masses,  no organomegaly Extremities: extremities normal, atraumatic, no cyanosis or edema Pulses: 2+ and symmetric Skin: Skin color, texture, turgor normal. No rashes or lesions Lymph nodes: No supraclavicular or anterior cervical lymph node enlargement Neuro: No focal neurologic deficits.  Wears glasses     09/29/2024   10:48 AM 04/14/2024    1:46 PM 01/21/2024    3:24 PM  Depression screen PHQ 2/9  Decreased Interest 0 0 0  Down, Depressed, Hopeless 0 0 0  PHQ - 2 Score 0 0 0  Altered sleeping 0 0 0  Tired, decreased energy 0 0 0  Change in appetite 0 0 0   Feeling bad or failure about yourself  0 0 0  Trouble concentrating 0 0 0  Moving slowly or fidgety/restless 0 0 0  Suicidal thoughts 0 0 0  PHQ-9 Score 0 0  0   Difficult doing work/chores Not difficult at all  Not difficult at all     Data saved with a previous flowsheet row definition      09/29/2024   10:48 AM 04/14/2024    1:46 PM 01/21/2024    3:24 PM 09/17/2023    8:21 AM  GAD 7 : Generalized Anxiety Score  Nervous, Anxious, on Edge 0 0 0 0  Control/stop worrying 0 0 0 0  Worry too much - different things 0 0 0 0  Trouble relaxing 0 0 0 0  Restless 0 0 0 0  Easily annoyed or irritable 0 0 0 0  Afraid - awful might happen 0 0 0 0  Total GAD 7 Score 0 0 0 0  Anxiety Difficulty Not difficult at all Not difficult at all  Not difficult at all    No results found for this or any previous visit (from the past 2160 hours).   Assessment/ Plan: Rachel Larson here for annual physical exam.   Annual physical exam  OSA on CPAP - Plan: CMP14+EGFR  Barrett's esophagus with dysplasia - Plan: CMP14+EGFR, CBC with Differential, esomeprazole  (NEXIUM ) 40 MG capsule  Elevated blood sugar - Plan: CMP14+EGFR, Bayer DCA Hb A1c Waived  BMI 34.0-34.9,adult - Plan: CMP14+EGFR, Bayer DCA Hb A1c Waived, VITAMIN D  25 Hydroxy (Vit-D Deficiency, Fractures)  Mixed hyperlipidemia - Plan: CMP14+EGFR, Lipid Panel, TSH, rosuvastatin  (CRESTOR ) 20 MG tablet  Osteopenia of left femoral neck - Plan: DG [facility] DEXA, CMP14+EGFR, VITAMIN D  25 Hydroxy (Vit-D Deficiency, Fractures)  Overactive bladder   Continue CPAP.  She is actively working on weight loss and is now out of morbid obesity range and BMI is down to 34.  Continue lifestyle modification to promote healthy weight.  Letter given for weight goal of 150 pounds which would put her in overweight category but  would be out of obesity category.  I think this is a reasonable weight goal at this time.  Encourage protein, hydration and  high-fiber.  Check A1c given history of elevations in blood sugar.  Given her history of Barrett's esophagus with dysplasia would recommend regular PPI use rather than as needed as the risk of vitamin deficiencies does not outweigh the risk of potential for cancer given these findings  Fasting labs were collected.  Check vitamin D  and DEXA scan given history of osteopenia  As the Ditropan  was too drying for the patient we can consider something like Myrbetriq going forward should she need any medications.  Hopefully she will see improvement in bladder issues in general with adequate hydration and weight loss  Counseled on healthy lifestyle choices, including diet (rich in fruits, vegetables and lean meats and low in salt and simple carbohydrates) and exercise (at least 30 minutes of moderate physical activity daily).  Patient to follow up 1 year for CPE  Tommaso Cavitt M. Jolinda, DO

## 2024-09-29 NOTE — Patient Instructions (Signed)
 Schedule DEXA scan at checkout today.

## 2024-09-30 LAB — LIPID PANEL
Chol/HDL Ratio: 2.9 ratio (ref 0.0–4.4)
Cholesterol, Total: 160 mg/dL (ref 100–199)
HDL: 55 mg/dL (ref >39)
LDL Chol Calc (NIH): 83 mg/dL (ref 0–99)
Triglycerides: 125 mg/dL (ref 0–149)
VLDL Cholesterol Cal: 22 mg/dL (ref 5–40)

## 2024-09-30 LAB — CMP14+EGFR
ALT: 12 IU/L (ref 0–32)
AST: 11 IU/L (ref 0–40)
Albumin: 4.3 g/dL (ref 3.9–4.9)
Alkaline Phosphatase: 86 IU/L (ref 49–135)
BUN/Creatinine Ratio: 16 (ref 12–28)
BUN: 12 mg/dL (ref 8–27)
Bilirubin Total: 0.3 mg/dL (ref 0.0–1.2)
CO2: 23 mmol/L (ref 20–29)
Calcium: 9.1 mg/dL (ref 8.7–10.3)
Chloride: 105 mmol/L (ref 96–106)
Creatinine, Ser: 0.73 mg/dL (ref 0.57–1.00)
Globulin, Total: 1.9 g/dL (ref 1.5–4.5)
Glucose: 111 mg/dL — ABNORMAL HIGH (ref 70–99)
Potassium: 4.1 mmol/L (ref 3.5–5.2)
Sodium: 142 mmol/L (ref 134–144)
Total Protein: 6.2 g/dL (ref 6.0–8.5)
eGFR: 94 mL/min/1.73 (ref >59)

## 2024-09-30 LAB — CBC WITH DIFFERENTIAL/PLATELET
Basophils Absolute: 0 x10E3/uL (ref 0.0–0.2)
Basos: 1 %
EOS (ABSOLUTE): 0.2 x10E3/uL (ref 0.0–0.4)
Eos: 4 %
Hematocrit: 37.2 % (ref 34.0–46.6)
Hemoglobin: 12.4 g/dL (ref 11.1–15.9)
Immature Grans (Abs): 0 x10E3/uL (ref 0.0–0.1)
Immature Granulocytes: 0 %
Lymphocytes Absolute: 1.4 x10E3/uL (ref 0.7–3.1)
Lymphs: 40 %
MCH: 30.9 pg (ref 26.6–33.0)
MCHC: 33.3 g/dL (ref 31.5–35.7)
MCV: 93 fL (ref 79–97)
Monocytes Absolute: 0.3 x10E3/uL (ref 0.1–0.9)
Monocytes: 9 %
Neutrophils Absolute: 1.6 x10E3/uL (ref 1.4–7.0)
Neutrophils: 46 %
Platelets: 166 x10E3/uL (ref 150–450)
RBC: 4.01 x10E6/uL (ref 3.77–5.28)
RDW: 13.5 % (ref 11.7–15.4)
WBC: 3.5 x10E3/uL (ref 3.4–10.8)

## 2024-09-30 LAB — VITAMIN D 25 HYDROXY (VIT D DEFICIENCY, FRACTURES): Vit D, 25-Hydroxy: 39.7 ng/mL (ref 30.0–100.0)

## 2024-09-30 LAB — TSH: TSH: 1.45 u[IU]/mL (ref 0.450–4.500)

## 2024-10-01 ENCOUNTER — Ambulatory Visit: Payer: Self-pay | Admitting: Family Medicine

## 2024-10-06 ENCOUNTER — Other Ambulatory Visit

## 2025-10-03 ENCOUNTER — Encounter: Admitting: Family Medicine
